# Patient Record
Sex: Female | Born: 1950 | Race: White | Hispanic: No | Marital: Married | Smoking: Never smoker
Health system: Southern US, Community
[De-identification: ages and names within clinical notes are randomized; demographics above are authoritative.]

## PROBLEM LIST (undated history)

## (undated) DIAGNOSIS — K219 Gastro-esophageal reflux disease without esophagitis: Secondary | ICD-10-CM

## (undated) DIAGNOSIS — I10 Essential (primary) hypertension: Secondary | ICD-10-CM

## (undated) DIAGNOSIS — C801 Malignant (primary) neoplasm, unspecified: Secondary | ICD-10-CM

## (undated) DIAGNOSIS — E782 Mixed hyperlipidemia: Secondary | ICD-10-CM

## (undated) DIAGNOSIS — E039 Hypothyroidism, unspecified: Secondary | ICD-10-CM

## (undated) DIAGNOSIS — I4891 Unspecified atrial fibrillation: Secondary | ICD-10-CM

## (undated) DIAGNOSIS — C4490 Unspecified malignant neoplasm of skin, unspecified: Secondary | ICD-10-CM

## (undated) HISTORY — PX: SKIN CANCER DESTRUCTION: SHX778

## (undated) HISTORY — DX: Gastro-esophageal reflux disease without esophagitis: K21.9

## (undated) HISTORY — DX: Hypothyroidism, unspecified: E03.9

## (undated) HISTORY — DX: Unspecified atrial fibrillation: I48.91

## (undated) HISTORY — PX: ABDOMINAL HYSTERECTOMY: SHX81

## (undated) HISTORY — PX: FACIAL RECONSTRUCTION SURGERY: SHX631

## (undated) HISTORY — DX: Unspecified malignant neoplasm of skin, unspecified: C44.90

## (undated) HISTORY — DX: Mixed hyperlipidemia: E78.2

## (undated) HISTORY — PX: OTHER SURGICAL HISTORY: SHX169

## (undated) HISTORY — DX: Essential (primary) hypertension: I10

## (undated) HISTORY — PX: SKIN CANCER EXCISION: SHX779

---

## 2004-10-12 ENCOUNTER — Ambulatory Visit: Payer: Self-pay | Admitting: *Deleted

## 2004-10-16 ENCOUNTER — Ambulatory Visit (HOSPITAL_COMMUNITY): Admission: RE | Admit: 2004-10-16 | Discharge: 2004-10-16 | Payer: Self-pay | Admitting: *Deleted

## 2004-10-16 ENCOUNTER — Ambulatory Visit: Payer: Self-pay | Admitting: Cardiology

## 2004-10-23 ENCOUNTER — Ambulatory Visit: Payer: Self-pay | Admitting: *Deleted

## 2014-11-24 ENCOUNTER — Emergency Department (HOSPITAL_COMMUNITY): Payer: Medicaid Other

## 2014-11-24 ENCOUNTER — Encounter (HOSPITAL_COMMUNITY): Payer: Self-pay | Admitting: Emergency Medicine

## 2014-11-24 ENCOUNTER — Emergency Department (HOSPITAL_COMMUNITY)
Admission: EM | Admit: 2014-11-24 | Discharge: 2014-11-24 | Disposition: A | Discharge status: Home / Self Care | Payer: Medicaid Other | Attending: Emergency Medicine | Admitting: Emergency Medicine

## 2014-11-24 DIAGNOSIS — W1839XA Other fall on same level, initial encounter: Secondary | ICD-10-CM | POA: Diagnosis not present

## 2014-11-24 DIAGNOSIS — Z880 Allergy status to penicillin: Secondary | ICD-10-CM | POA: Diagnosis not present

## 2014-11-24 DIAGNOSIS — Z91040 Latex allergy status: Secondary | ICD-10-CM | POA: Insufficient documentation

## 2014-11-24 DIAGNOSIS — S42252A Displaced fracture of greater tuberosity of left humerus, initial encounter for closed fracture: Secondary | ICD-10-CM | POA: Insufficient documentation

## 2014-11-24 DIAGNOSIS — S4992XA Unspecified injury of left shoulder and upper arm, initial encounter: Secondary | ICD-10-CM | POA: Diagnosis present

## 2014-11-24 DIAGNOSIS — Y998 Other external cause status: Secondary | ICD-10-CM | POA: Insufficient documentation

## 2014-11-24 DIAGNOSIS — Z8679 Personal history of other diseases of the circulatory system: Secondary | ICD-10-CM | POA: Insufficient documentation

## 2014-11-24 DIAGNOSIS — Z8589 Personal history of malignant neoplasm of other organs and systems: Secondary | ICD-10-CM | POA: Insufficient documentation

## 2014-11-24 DIAGNOSIS — Y9289 Other specified places as the place of occurrence of the external cause: Secondary | ICD-10-CM | POA: Diagnosis not present

## 2014-11-24 DIAGNOSIS — Y9389 Activity, other specified: Secondary | ICD-10-CM | POA: Diagnosis not present

## 2014-11-24 DIAGNOSIS — S42102A Fracture of unspecified part of scapula, left shoulder, initial encounter for closed fracture: Secondary | ICD-10-CM | POA: Diagnosis present

## 2014-11-24 DIAGNOSIS — Z87891 Personal history of nicotine dependence: Secondary | ICD-10-CM | POA: Insufficient documentation

## 2014-11-24 DIAGNOSIS — S42302A Unspecified fracture of shaft of humerus, left arm, initial encounter for closed fracture: Secondary | ICD-10-CM

## 2014-11-24 HISTORY — DX: Malignant (primary) neoplasm, unspecified: C80.1

## 2014-11-24 HISTORY — DX: Unspecified atrial fibrillation: I48.91

## 2014-11-24 MED ORDER — HYDROMORPHONE HCL 1 MG/ML IJ SOLN
1.0000 mg | Freq: Once | INTRAMUSCULAR | Status: AC
  Administered 2014-11-24: 1 mg via INTRAMUSCULAR
  Filled 2014-11-24: qty 1

## 2014-11-24 MED ORDER — OXYCODONE-ACETAMINOPHEN 5-325 MG PO TABS: 1.0000 | ORAL_TABLET | ORAL | Status: DC | PRN

## 2014-11-24 MED ORDER — OXYCODONE-ACETAMINOPHEN 5-325 MG PO TABS
2.0000 | ORAL_TABLET | Freq: Once | ORAL | Status: AC
  Administered 2014-11-24: 2 via ORAL
  Filled 2014-11-24: qty 2

## 2014-11-24 MED ORDER — SODIUM CHLORIDE 0.9 % IV BOLUS (SEPSIS): 500.0000 mL | Freq: Once | INTRAVENOUS | Status: DC

## 2014-11-24 NOTE — ED Notes (Signed)
PT c/o falling upon her left shoulder approx 2 hrs ago. PT c/o left shoulder pain and difficulty with ROM.

## 2014-11-24 NOTE — Discharge Instructions (Signed)
Sling, pain medicine, follow-up with orthopedic doctor. Call tomorrow for appointment next week. Phone number given. You have broken your humerus bone

## 2014-11-24 NOTE — ED Provider Notes (Signed)
CSN: [id number]     Arrival date & time 11/24/14  1614 History  This chart was scribed for Nat Christen, MD by Delphia Grates, ED Scribe. This patient was seen in room APA03/APA03 and the patient's care was started at 4:57 PM.    Chief Complaint  Patient presents with  . Shoulder Pain    The history is provided by the patient. No language interpreter was used.     HPI Comments: Tracy Rivera is a 64 y.o. female who presents to the Emergency Department complaining of a fall that occurred approximately 2 hours ago. Patient states her knee gave out causing her to fall. She reports landing on her left side and subsequently injuring her left shoulder. She notes constant, severe pain to the shoulder that radiates down to the left upper arm. There is associated decreased ROM of the LUE. She denies LOC or any other injuries.   Past Medical History  Diagnosis Date  . Cancer   . A-fib    Past Surgical History  Procedure Laterality Date  . Skin cancer destruction    . Left arm nerve    . Facial reconstruction surgery     No family history on file. History  Substance Use Topics  . Smoking status: Former Research scientist (life sciences)  . Smokeless tobacco: Not on file  . Alcohol Use: No   OB History    Gravida Para Term Preterm AB TAB SAB Ectopic Multiple Living            2     Review of Systems  A complete 10 system review of systems was obtained and all systems are negative except as noted in the HPI and PMH.    Allergies  Propoxyphene; Celecoxib; Erythromycin; Lanolin; Latex; Naproxen; Other; Oxytetracycline; Penicillins; Prednisone; Tape; and Codeine  Home Medications   Prior to Admission medications   Medication Sig Start Date End Date Taking? Authorizing Provider  oxyCODONE-acetaminophen (PERCOCET) 5-325 MG per tablet Take 1-2 tablets by mouth every 4 (four) hours as needed. 11/24/14   Nat Christen, MD   Triage Vitals: BP 187/72 mmHg  Pulse 83  Temp(Src) 98 F (36.7 C) (Oral)  Resp 20  Ht  4\' 11"  (1.499 m)  Wt 176 lb (79.833 kg)  BMI 35.53 kg/m2  SpO2 98%  Physical Exam  Constitutional: She is oriented to person, place, and time. She appears well-developed and well-nourished.  HENT:  Head: Normocephalic.  Proximal upper palette and nose were not present.  Eyes: Conjunctivae and EOM are normal. Pupils are equal, round, and reactive to light.  Neck: Normal range of motion. Neck supple.  Cardiovascular: Normal rate and regular rhythm.   Pulmonary/Chest: Effort normal and breath sounds normal.  Abdominal: Soft. Bowel sounds are normal.  Musculoskeletal: Normal range of motion. She exhibits tenderness.  Tenderness to the left humerus.  Neurological: She is alert and oriented to person, place, and time.  Skin: Skin is warm and dry.  Psychiatric: She has a normal mood and affect. Her behavior is normal.  Nursing note and vitals reviewed.   ED Course  Procedures (including critical care time)  DIAGNOSTIC STUDIES: Oxygen Saturation is 98% on room air, normal by my interpretation.    COORDINATION OF CARE: At 1700 Discussed treatment plan with patient which includes imaging. Patient agrees.   At [place] Discussed with patient the imaging results that revealed positive findings for fracture of the left scapula and humerus. Informed patient that this type of fracture will heal on its own  and does not require surgical repair. Patient informed me that she had surgery on the left scapula area and states that she is not in any pain in that area at this time.  Will order sling and prescribe pain medication. Patient agrees.   Labs Review Labs Reviewed - No data to display  Imaging Review  Dg Shoulder Left  11/24/2014   CLINICAL DATA:  Left shoulder pain secondary to a fall.  EXAM: LEFT SHOULDER - 2+ VIEW  COMPARISON:  CT scan of the neck dated 11/29/2010  FINDINGS: There is an impacted fracture of the left humeral neck as well as a fracture through the greater tuberosity of the  proximal humerus.  There also appears to be a comminuted angulated fracture of the scapula.  IMPRESSION: 1. Comminuted fracture of the scapula. 2. Impacted comminuted fracture of the proximal left humerus.   Electronically Signed   By: Lorriane Shire M.D.   On: 11/24/2014 17:55   Dg Humerus Left  11/24/2014   CLINICAL DATA:  LEFT shoulder and elbow pain, slipped and fell landing on LEFT shoulder  EXAM: LEFT HUMERUS - 2+ VIEW  COMPARISON:  11/24/2014 LEFT shoulder radiographs  FINDINGS: Osseous demineralization.  AC joint alignment normal.  Minimally displaced fracture surgical neck LEFT humerus.  No acute fracture, dislocation or bone destruction.  Surgical clips LEFT axilla.  IMPRESSION: Osseous mineralization with mildly displaced fracture at surgical neck LEFT humerus.   Electronically Signed   By: Lavonia Dana M.D.   On: 11/24/2014 17:51      EKG Interpretation None      MDM   Final diagnoses:  Humerus fracture, left, closed, initial encounter   Further history obtained from patient and her husband. She has had a previous bone graft taken from the left scapula. She is nontender over the scapula. Abnormal x-ray reading pertains to previous scapular surgery. Sling, pain medicine, referral to orthopedics for left humeral fracture    I personally performed the services described in this documentation, which was scribed in my presence. The recorded information has been reviewed and is accurate.   Nat Christen, MD 11/24/14 [phone]

## 2014-11-24 NOTE — ED Notes (Signed)
Pt back from xray. Pt stated she needed to go to restroom prior to being medicated. Husband wished to take her himself by wheelchair. NAD at this time. Pt to be medicated after using restroom.

## 2016-02-26 DIAGNOSIS — R928 Other abnormal and inconclusive findings on diagnostic imaging of breast: Secondary | ICD-10-CM | POA: Diagnosis not present

## 2016-02-26 DIAGNOSIS — J209 Acute bronchitis, unspecified: Secondary | ICD-10-CM | POA: Diagnosis not present

## 2016-02-26 DIAGNOSIS — J019 Acute sinusitis, unspecified: Secondary | ICD-10-CM | POA: Diagnosis not present

## 2016-04-03 DIAGNOSIS — M25562 Pain in left knee: Secondary | ICD-10-CM | POA: Diagnosis not present

## 2016-04-03 DIAGNOSIS — M170 Bilateral primary osteoarthritis of knee: Secondary | ICD-10-CM | POA: Diagnosis not present

## 2016-04-03 DIAGNOSIS — M25561 Pain in right knee: Secondary | ICD-10-CM | POA: Diagnosis not present

## 2016-04-26 DIAGNOSIS — E782 Mixed hyperlipidemia: Secondary | ICD-10-CM | POA: Diagnosis not present

## 2016-04-26 DIAGNOSIS — R946 Abnormal results of thyroid function studies: Secondary | ICD-10-CM | POA: Diagnosis not present

## 2016-04-26 DIAGNOSIS — I10 Essential (primary) hypertension: Secondary | ICD-10-CM | POA: Diagnosis not present

## 2016-04-30 DIAGNOSIS — E782 Mixed hyperlipidemia: Secondary | ICD-10-CM | POA: Diagnosis not present

## 2016-04-30 DIAGNOSIS — R946 Abnormal results of thyroid function studies: Secondary | ICD-10-CM | POA: Diagnosis not present

## 2016-04-30 DIAGNOSIS — Z85828 Personal history of other malignant neoplasm of skin: Secondary | ICD-10-CM | POA: Diagnosis not present

## 2016-04-30 DIAGNOSIS — I10 Essential (primary) hypertension: Secondary | ICD-10-CM | POA: Diagnosis not present

## 2016-04-30 DIAGNOSIS — J019 Acute sinusitis, unspecified: Secondary | ICD-10-CM | POA: Diagnosis not present

## 2016-04-30 DIAGNOSIS — I482 Chronic atrial fibrillation: Secondary | ICD-10-CM | POA: Diagnosis not present

## 2016-07-07 IMAGING — DX DG SHOULDER 2+V*L*
2 series · 2 of 2 positions shown · non-contrast
Comparison: CT scan of the neck dated 11/29/2010

CLINICAL DATA: Left shoulder pain secondary to a fall.

EXAM:
LEFT SHOULDER - 2+ VIEW

[shoulder grashey]
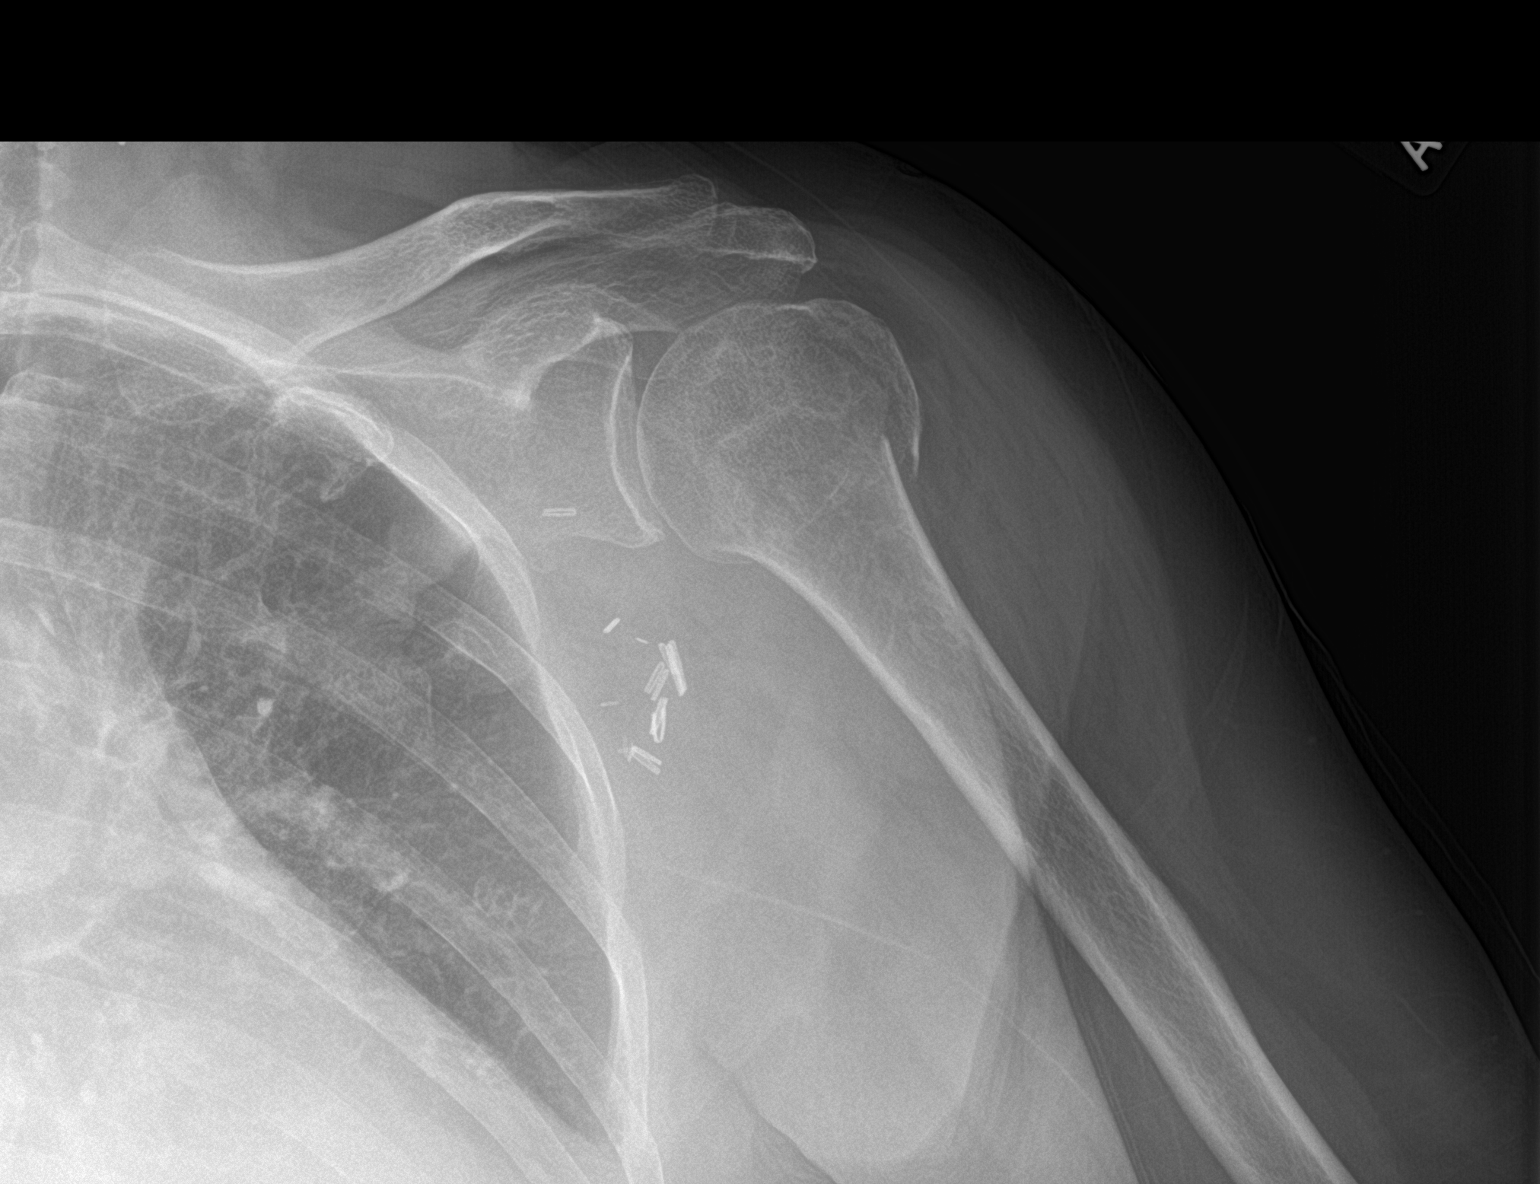

[shoulder y view]
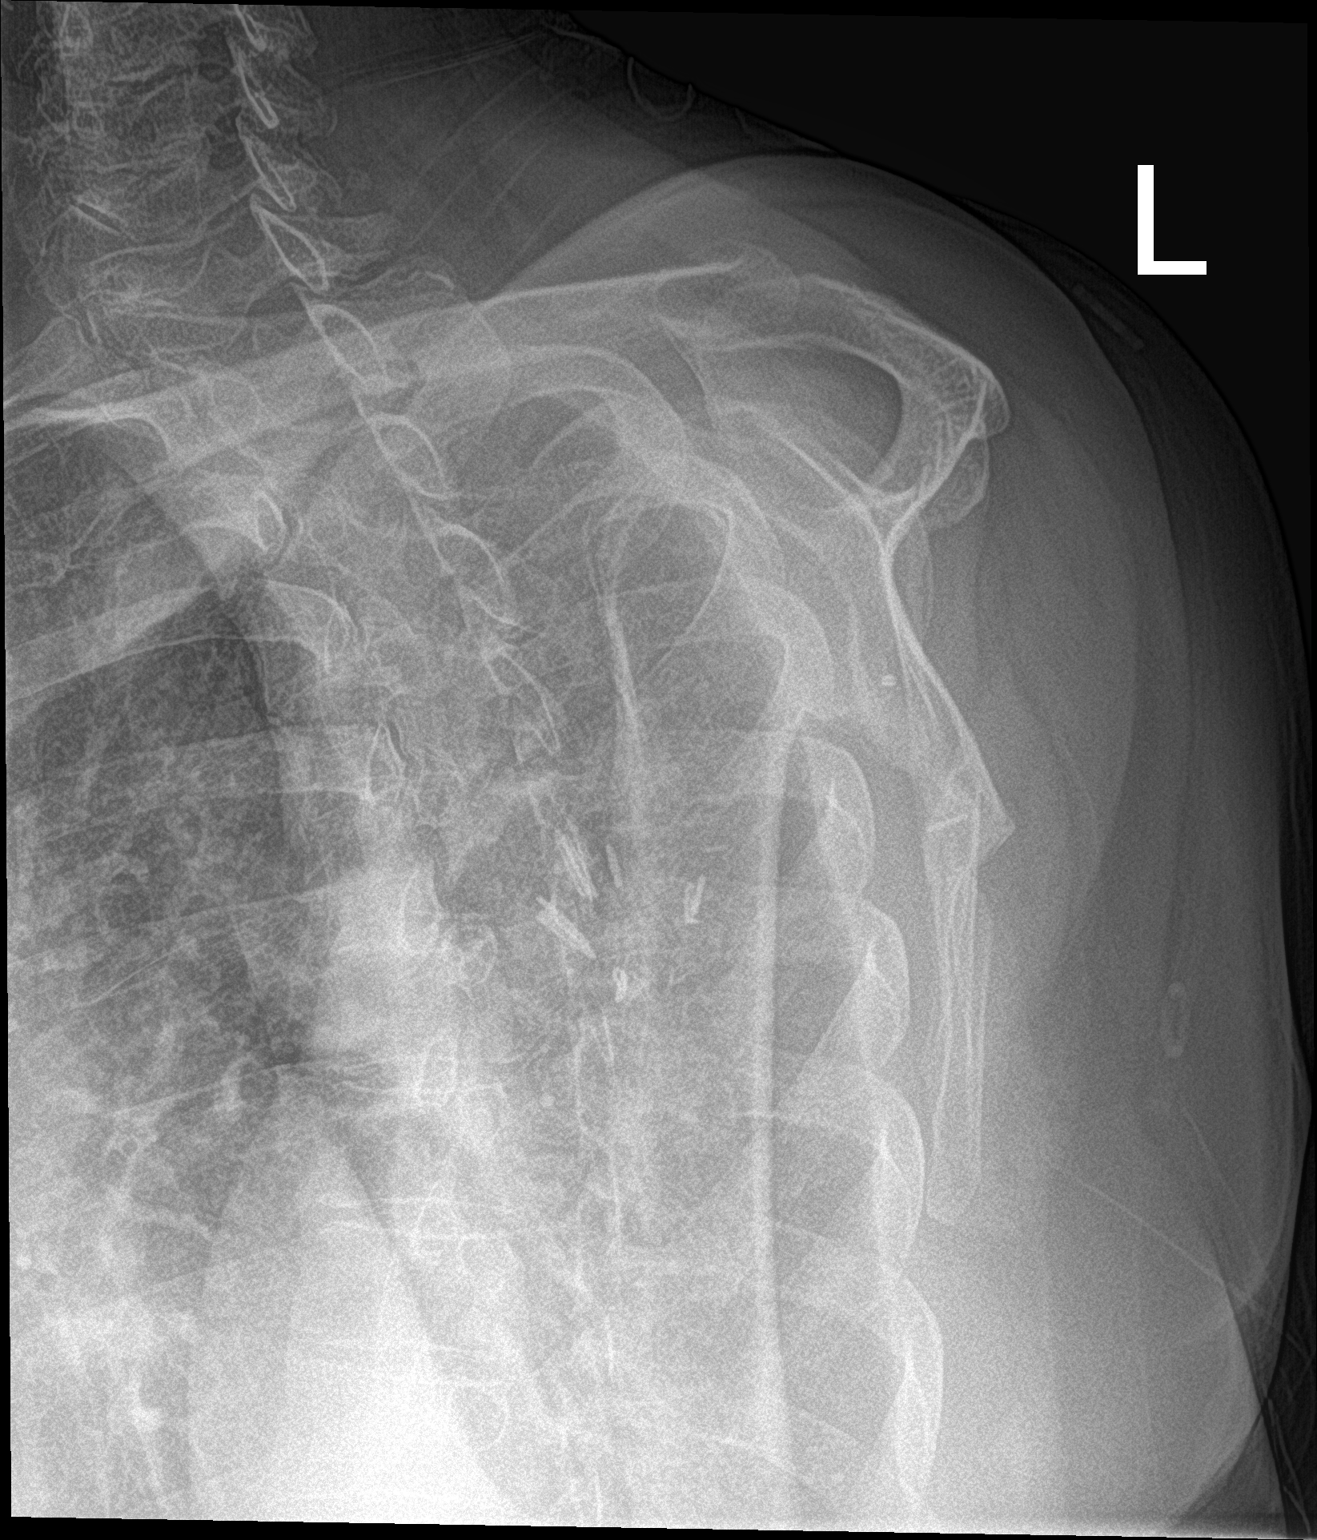

[2 of 2 positions shown; findings below may reference images not displayed]

FINDINGS: There is an impacted fracture of the left humeral neck as well as a
fracture through the greater tuberosity of the proximal humerus.

There also appears to be a comminuted angulated fracture of the
scapula.
IMPRESSION: 1. Comminuted fracture of the scapula.
2. Impacted comminuted fracture of the proximal left humerus.

## 2016-07-07 IMAGING — DX DG HUMERUS 2V *L*
3 series · 3 of 3 positions shown · non-contrast
Comparison: 11/24/2014 LEFT shoulder radiographs

CLINICAL DATA: LEFT shoulder and elbow pain, slipped and fell
landing on LEFT shoulder

EXAM:
LEFT HUMERUS - 2+ VIEW

[humerus ap]
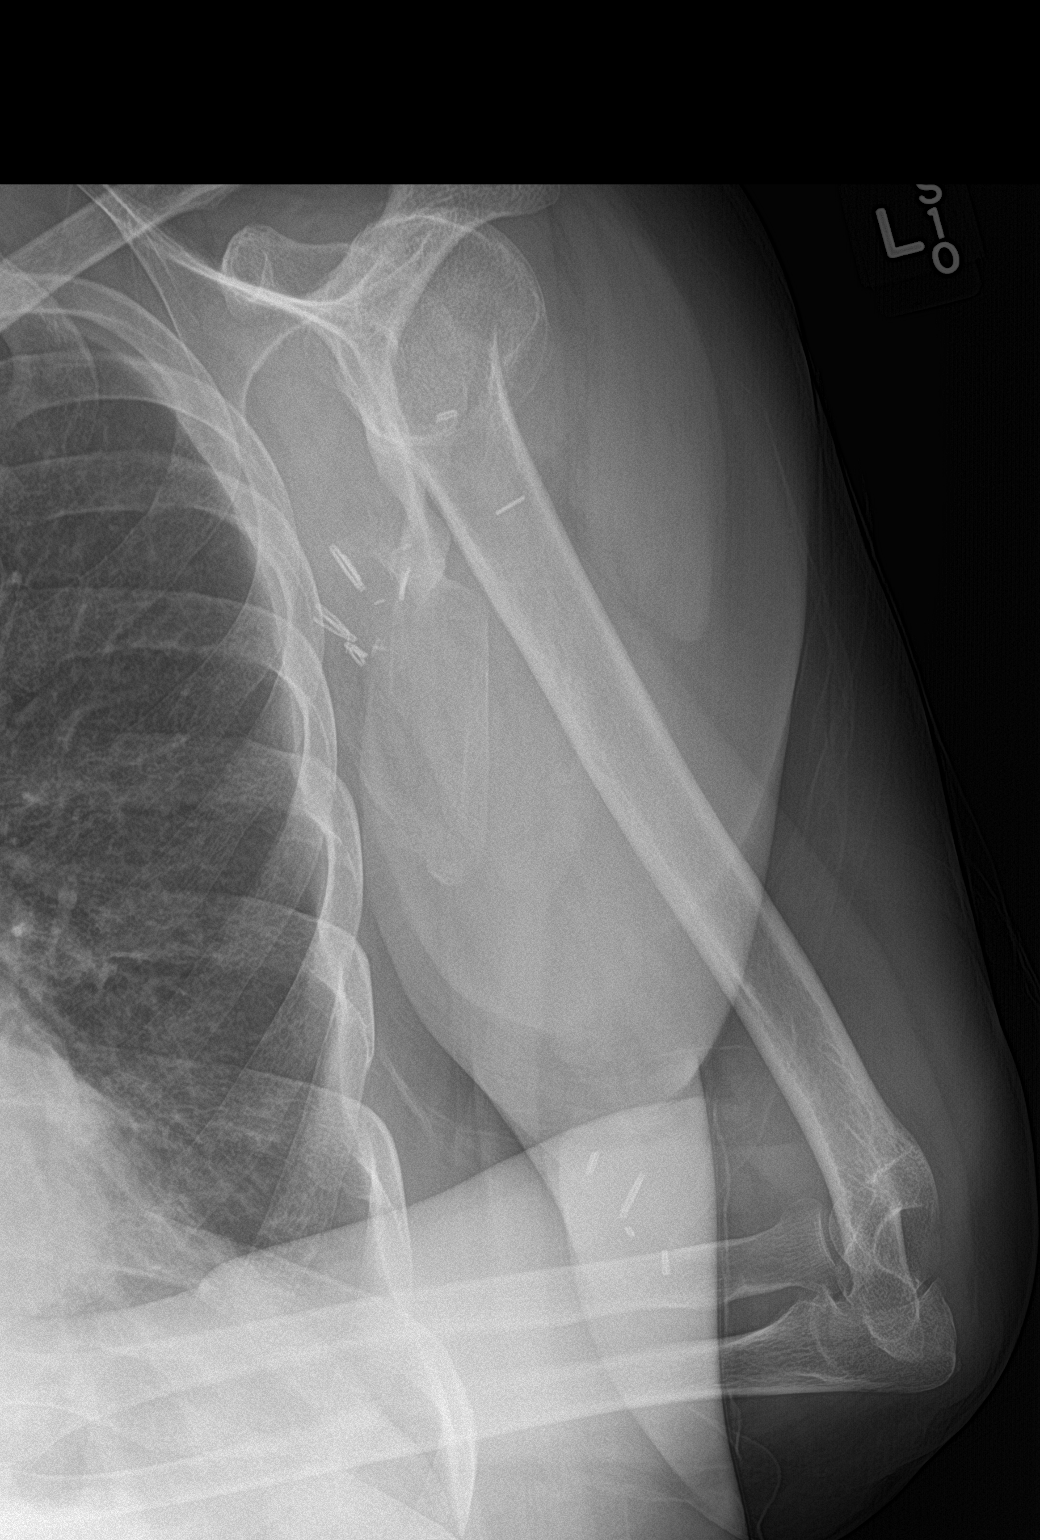

[humerus lat (1 of 2)]
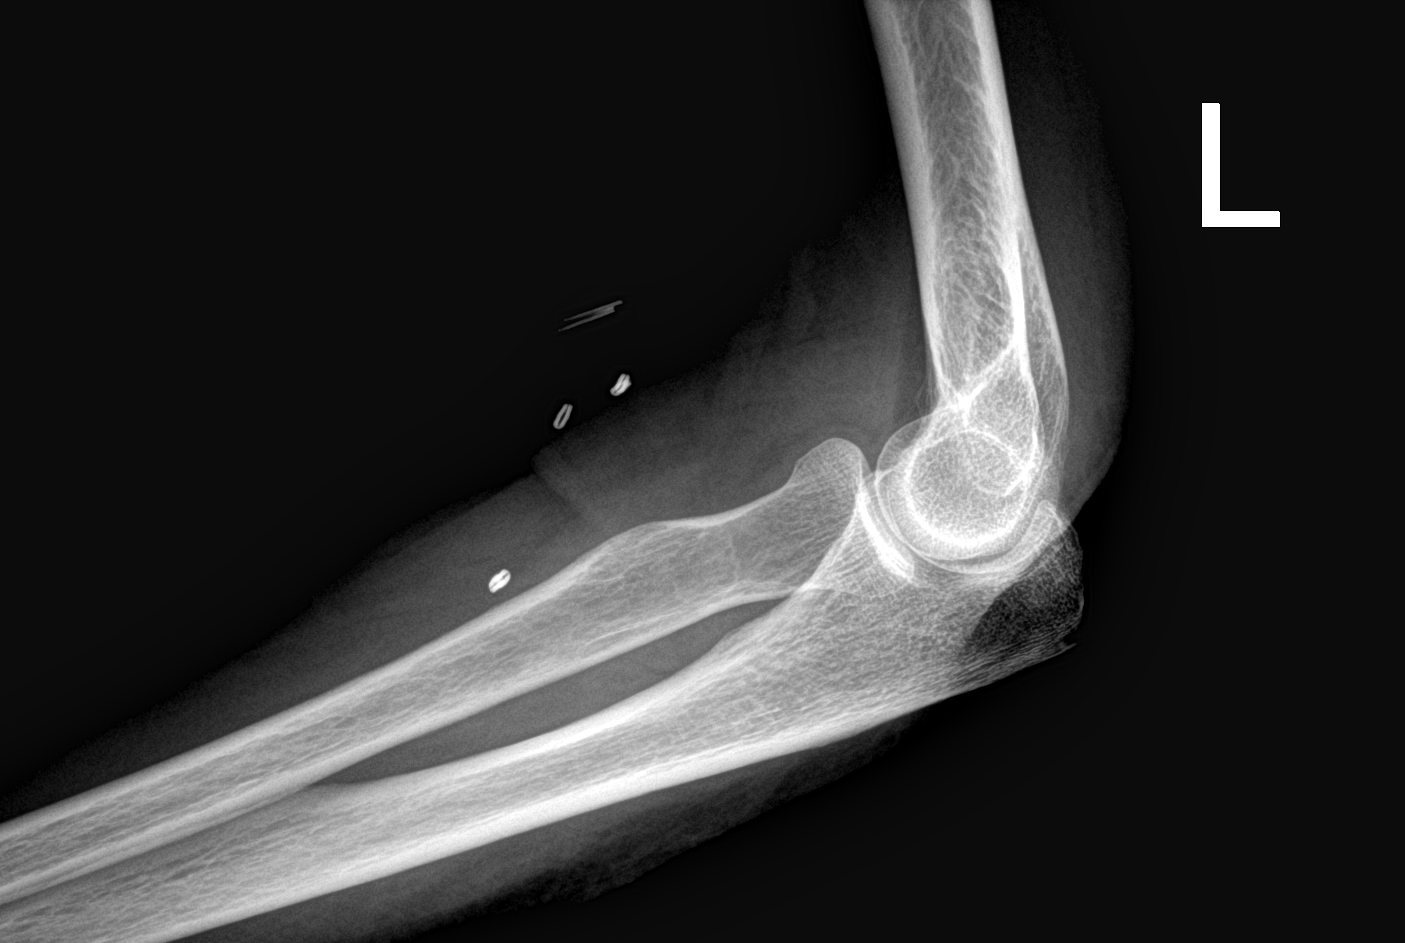

[humerus lat (2 of 2)]
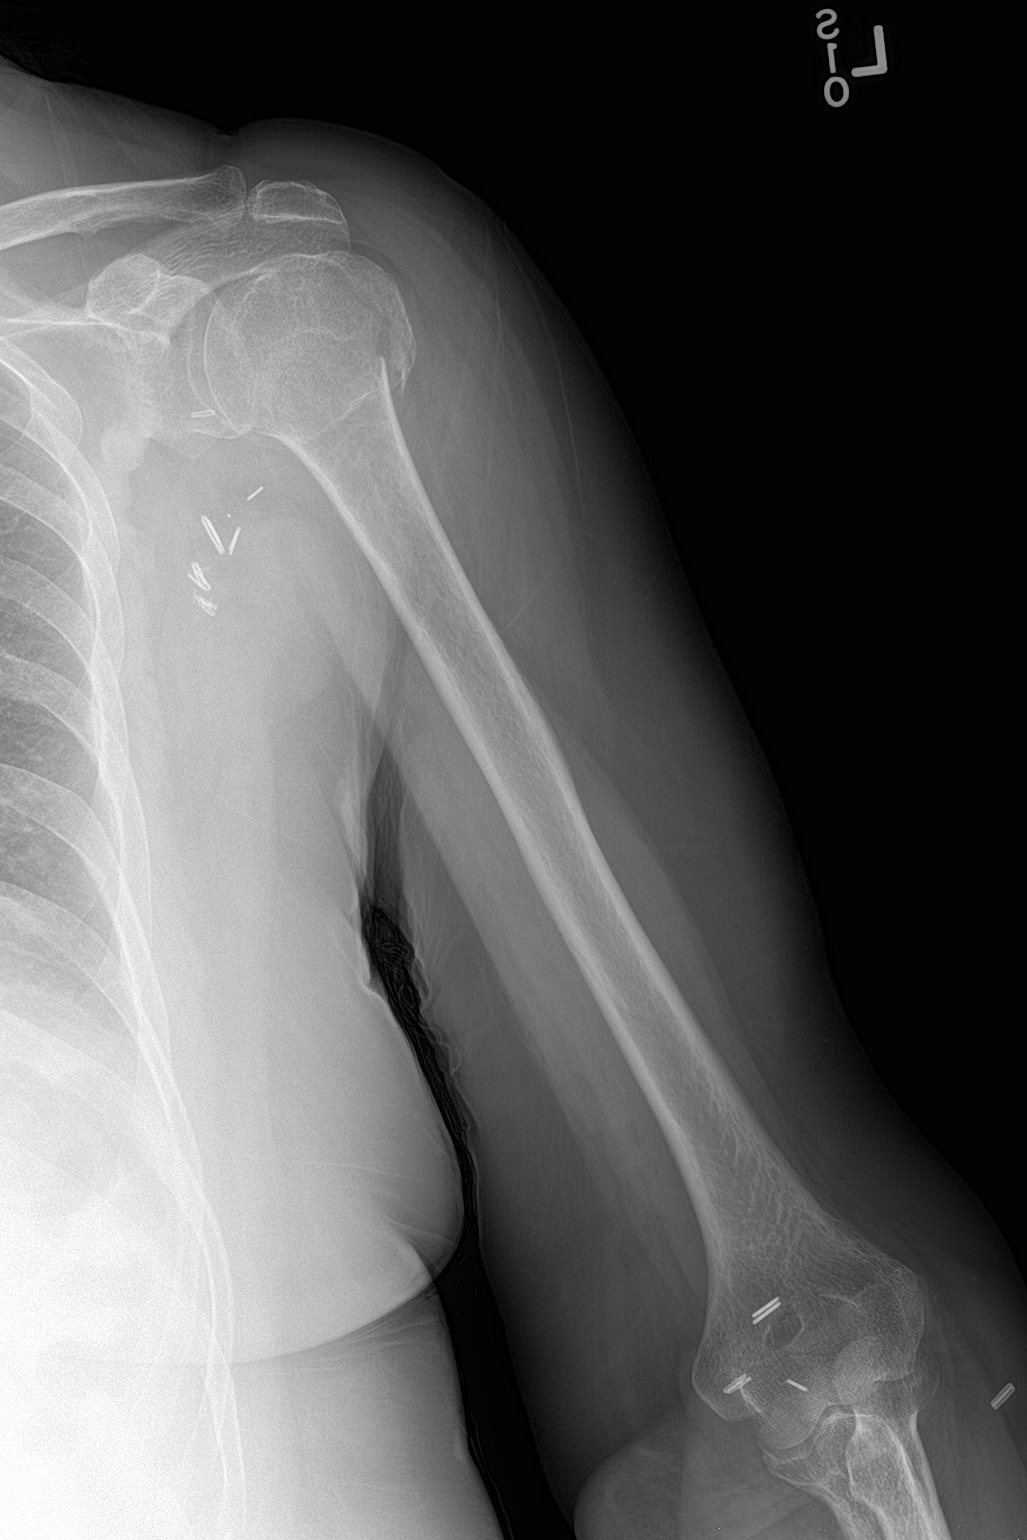

[3 of 3 positions shown; findings below may reference images not displayed]

FINDINGS: Osseous demineralization.

AC joint alignment normal.

Minimally displaced fracture surgical neck LEFT humerus.

No acute fracture, dislocation or bone destruction.

Surgical clips LEFT axilla.
IMPRESSION: Osseous mineralization with mildly displaced fracture at surgical
neck LEFT humerus.

## 2016-07-30 DIAGNOSIS — Z23 Encounter for immunization: Secondary | ICD-10-CM | POA: Diagnosis not present

## 2016-08-05 DIAGNOSIS — I10 Essential (primary) hypertension: Secondary | ICD-10-CM | POA: Diagnosis not present

## 2016-08-05 DIAGNOSIS — M950 Acquired deformity of nose: Secondary | ICD-10-CM | POA: Diagnosis not present

## 2016-08-05 DIAGNOSIS — Z923 Personal history of irradiation: Secondary | ICD-10-CM | POA: Diagnosis not present

## 2016-08-05 DIAGNOSIS — Z79899 Other long term (current) drug therapy: Secondary | ICD-10-CM | POA: Diagnosis not present

## 2016-08-05 DIAGNOSIS — Z880 Allergy status to penicillin: Secondary | ICD-10-CM | POA: Diagnosis not present

## 2016-08-05 DIAGNOSIS — Z87891 Personal history of nicotine dependence: Secondary | ICD-10-CM | POA: Diagnosis not present

## 2016-08-05 DIAGNOSIS — Z9089 Acquired absence of other organs: Secondary | ICD-10-CM | POA: Diagnosis not present

## 2016-08-05 DIAGNOSIS — Z886 Allergy status to analgesic agent status: Secondary | ICD-10-CM | POA: Diagnosis not present

## 2016-08-05 DIAGNOSIS — Z888 Allergy status to other drugs, medicaments and biological substances status: Secondary | ICD-10-CM | POA: Diagnosis not present

## 2016-08-05 DIAGNOSIS — Z09 Encounter for follow-up examination after completed treatment for conditions other than malignant neoplasm: Secondary | ICD-10-CM | POA: Diagnosis not present

## 2016-08-05 DIAGNOSIS — I4891 Unspecified atrial fibrillation: Secondary | ICD-10-CM | POA: Diagnosis not present

## 2016-08-05 DIAGNOSIS — Z881 Allergy status to other antibiotic agents status: Secondary | ICD-10-CM | POA: Diagnosis not present

## 2016-08-05 DIAGNOSIS — Z885 Allergy status to narcotic agent status: Secondary | ICD-10-CM | POA: Diagnosis not present

## 2016-08-28 DIAGNOSIS — R946 Abnormal results of thyroid function studies: Secondary | ICD-10-CM | POA: Diagnosis not present

## 2016-08-28 DIAGNOSIS — I10 Essential (primary) hypertension: Secondary | ICD-10-CM | POA: Diagnosis not present

## 2016-08-28 DIAGNOSIS — E782 Mixed hyperlipidemia: Secondary | ICD-10-CM | POA: Diagnosis not present

## 2016-08-30 DIAGNOSIS — E782 Mixed hyperlipidemia: Secondary | ICD-10-CM | POA: Diagnosis not present

## 2016-08-30 DIAGNOSIS — Z85828 Personal history of other malignant neoplasm of skin: Secondary | ICD-10-CM | POA: Diagnosis not present

## 2016-08-30 DIAGNOSIS — I482 Chronic atrial fibrillation: Secondary | ICD-10-CM | POA: Diagnosis not present

## 2016-08-30 DIAGNOSIS — E039 Hypothyroidism, unspecified: Secondary | ICD-10-CM | POA: Diagnosis not present

## 2016-08-30 DIAGNOSIS — I10 Essential (primary) hypertension: Secondary | ICD-10-CM | POA: Diagnosis not present

## 2016-09-11 DIAGNOSIS — M25562 Pain in left knee: Secondary | ICD-10-CM | POA: Diagnosis not present

## 2016-09-11 DIAGNOSIS — Z4789 Encounter for other orthopedic aftercare: Secondary | ICD-10-CM | POA: Diagnosis not present

## 2016-09-11 DIAGNOSIS — M25561 Pain in right knee: Secondary | ICD-10-CM | POA: Diagnosis not present

## 2016-09-11 DIAGNOSIS — M170 Bilateral primary osteoarthritis of knee: Secondary | ICD-10-CM | POA: Diagnosis not present

## 2016-09-27 DIAGNOSIS — M170 Bilateral primary osteoarthritis of knee: Secondary | ICD-10-CM | POA: Diagnosis not present

## 2016-09-27 DIAGNOSIS — M25362 Other instability, left knee: Secondary | ICD-10-CM | POA: Diagnosis not present

## 2016-09-27 DIAGNOSIS — M25361 Other instability, right knee: Secondary | ICD-10-CM | POA: Diagnosis not present

## 2016-10-10 DIAGNOSIS — E039 Hypothyroidism, unspecified: Secondary | ICD-10-CM | POA: Diagnosis not present

## 2016-11-04 DIAGNOSIS — R590 Localized enlarged lymph nodes: Secondary | ICD-10-CM | POA: Diagnosis not present

## 2016-11-04 DIAGNOSIS — R599 Enlarged lymph nodes, unspecified: Secondary | ICD-10-CM | POA: Diagnosis not present

## 2016-11-04 DIAGNOSIS — Z885 Allergy status to narcotic agent status: Secondary | ICD-10-CM | POA: Diagnosis not present

## 2016-11-04 DIAGNOSIS — I4891 Unspecified atrial fibrillation: Secondary | ICD-10-CM | POA: Diagnosis not present

## 2016-11-04 DIAGNOSIS — Z886 Allergy status to analgesic agent status: Secondary | ICD-10-CM | POA: Diagnosis not present

## 2016-11-04 DIAGNOSIS — Z881 Allergy status to other antibiotic agents status: Secondary | ICD-10-CM | POA: Diagnosis not present

## 2016-11-04 DIAGNOSIS — Z7901 Long term (current) use of anticoagulants: Secondary | ICD-10-CM | POA: Diagnosis not present

## 2016-11-04 DIAGNOSIS — Z888 Allergy status to other drugs, medicaments and biological substances status: Secondary | ICD-10-CM | POA: Diagnosis not present

## 2016-11-04 DIAGNOSIS — M950 Acquired deformity of nose: Secondary | ICD-10-CM | POA: Diagnosis not present

## 2016-11-04 DIAGNOSIS — C300 Malignant neoplasm of nasal cavity: Secondary | ICD-10-CM | POA: Diagnosis not present

## 2016-11-04 DIAGNOSIS — Z87891 Personal history of nicotine dependence: Secondary | ICD-10-CM | POA: Diagnosis not present

## 2016-11-04 DIAGNOSIS — Z7982 Long term (current) use of aspirin: Secondary | ICD-10-CM | POA: Diagnosis not present

## 2016-11-04 DIAGNOSIS — Z880 Allergy status to penicillin: Secondary | ICD-10-CM | POA: Diagnosis not present

## 2016-11-04 DIAGNOSIS — Z9889 Other specified postprocedural states: Secondary | ICD-10-CM | POA: Diagnosis not present

## 2016-11-04 DIAGNOSIS — I10 Essential (primary) hypertension: Secondary | ICD-10-CM | POA: Diagnosis not present

## 2016-11-04 DIAGNOSIS — Z85828 Personal history of other malignant neoplasm of skin: Secondary | ICD-10-CM | POA: Diagnosis not present

## 2016-11-04 DIAGNOSIS — Z79899 Other long term (current) drug therapy: Secondary | ICD-10-CM | POA: Diagnosis not present

## 2016-11-15 DIAGNOSIS — C300 Malignant neoplasm of nasal cavity: Secondary | ICD-10-CM | POA: Diagnosis not present

## 2016-11-15 DIAGNOSIS — M950 Acquired deformity of nose: Secondary | ICD-10-CM | POA: Diagnosis not present

## 2016-11-15 DIAGNOSIS — R590 Localized enlarged lymph nodes: Secondary | ICD-10-CM | POA: Diagnosis not present

## 2016-12-11 DIAGNOSIS — C300 Malignant neoplasm of nasal cavity: Secondary | ICD-10-CM | POA: Diagnosis not present

## 2016-12-16 DIAGNOSIS — C300 Malignant neoplasm of nasal cavity: Secondary | ICD-10-CM | POA: Diagnosis not present

## 2016-12-16 DIAGNOSIS — Z87891 Personal history of nicotine dependence: Secondary | ICD-10-CM | POA: Diagnosis not present

## 2016-12-23 DIAGNOSIS — I482 Chronic atrial fibrillation: Secondary | ICD-10-CM | POA: Diagnosis not present

## 2016-12-23 DIAGNOSIS — E782 Mixed hyperlipidemia: Secondary | ICD-10-CM | POA: Diagnosis not present

## 2016-12-23 DIAGNOSIS — E039 Hypothyroidism, unspecified: Secondary | ICD-10-CM | POA: Diagnosis not present

## 2016-12-23 DIAGNOSIS — I10 Essential (primary) hypertension: Secondary | ICD-10-CM | POA: Diagnosis not present

## 2016-12-25 DIAGNOSIS — I10 Essential (primary) hypertension: Secondary | ICD-10-CM | POA: Diagnosis not present

## 2016-12-25 DIAGNOSIS — I482 Chronic atrial fibrillation: Secondary | ICD-10-CM | POA: Diagnosis not present

## 2016-12-25 DIAGNOSIS — R946 Abnormal results of thyroid function studies: Secondary | ICD-10-CM | POA: Diagnosis not present

## 2016-12-25 DIAGNOSIS — J019 Acute sinusitis, unspecified: Secondary | ICD-10-CM | POA: Diagnosis not present

## 2016-12-25 DIAGNOSIS — Z85828 Personal history of other malignant neoplasm of skin: Secondary | ICD-10-CM | POA: Diagnosis not present

## 2016-12-25 DIAGNOSIS — E782 Mixed hyperlipidemia: Secondary | ICD-10-CM | POA: Diagnosis not present

## 2016-12-25 DIAGNOSIS — Z23 Encounter for immunization: Secondary | ICD-10-CM | POA: Diagnosis not present

## 2016-12-26 DIAGNOSIS — C300 Malignant neoplasm of nasal cavity: Secondary | ICD-10-CM | POA: Diagnosis not present

## 2016-12-26 DIAGNOSIS — H269 Unspecified cataract: Secondary | ICD-10-CM | POA: Diagnosis not present

## 2016-12-26 DIAGNOSIS — S0502XA Injury of conjunctiva and corneal abrasion without foreign body, left eye, initial encounter: Secondary | ICD-10-CM | POA: Diagnosis not present

## 2016-12-26 DIAGNOSIS — I481 Persistent atrial fibrillation: Secondary | ICD-10-CM | POA: Diagnosis not present

## 2016-12-26 DIAGNOSIS — C44311 Basal cell carcinoma of skin of nose: Secondary | ICD-10-CM | POA: Diagnosis not present

## 2016-12-26 DIAGNOSIS — M7989 Other specified soft tissue disorders: Secondary | ICD-10-CM | POA: Diagnosis not present

## 2017-01-02 DIAGNOSIS — H269 Unspecified cataract: Secondary | ICD-10-CM | POA: Diagnosis not present

## 2017-01-02 DIAGNOSIS — C7989 Secondary malignant neoplasm of other specified sites: Secondary | ICD-10-CM | POA: Diagnosis not present

## 2017-01-02 DIAGNOSIS — J349 Unspecified disorder of nose and nasal sinuses: Secondary | ICD-10-CM | POA: Diagnosis not present

## 2017-01-02 DIAGNOSIS — S0502XA Injury of conjunctiva and corneal abrasion without foreign body, left eye, initial encounter: Secondary | ICD-10-CM | POA: Diagnosis not present

## 2017-01-02 DIAGNOSIS — M899 Disorder of bone, unspecified: Secondary | ICD-10-CM | POA: Diagnosis not present

## 2017-01-02 DIAGNOSIS — C4481 Basal cell carcinoma of overlapping sites of skin: Secondary | ICD-10-CM | POA: Diagnosis not present

## 2017-01-02 DIAGNOSIS — C44311 Basal cell carcinoma of skin of nose: Secondary | ICD-10-CM | POA: Diagnosis not present

## 2017-01-03 DIAGNOSIS — S0502XA Injury of conjunctiva and corneal abrasion without foreign body, left eye, initial encounter: Secondary | ICD-10-CM | POA: Diagnosis not present

## 2017-01-03 DIAGNOSIS — H269 Unspecified cataract: Secondary | ICD-10-CM | POA: Diagnosis not present

## 2017-01-03 DIAGNOSIS — C44311 Basal cell carcinoma of skin of nose: Secondary | ICD-10-CM | POA: Diagnosis not present

## 2017-01-13 DIAGNOSIS — Z91040 Latex allergy status: Secondary | ICD-10-CM | POA: Diagnosis not present

## 2017-01-13 DIAGNOSIS — I4891 Unspecified atrial fibrillation: Secondary | ICD-10-CM | POA: Diagnosis not present

## 2017-01-13 DIAGNOSIS — Z886 Allergy status to analgesic agent status: Secondary | ICD-10-CM | POA: Diagnosis not present

## 2017-01-13 DIAGNOSIS — Z79899 Other long term (current) drug therapy: Secondary | ICD-10-CM | POA: Diagnosis not present

## 2017-01-13 DIAGNOSIS — Z09 Encounter for follow-up examination after completed treatment for conditions other than malignant neoplasm: Secondary | ICD-10-CM | POA: Diagnosis not present

## 2017-01-13 DIAGNOSIS — Z7982 Long term (current) use of aspirin: Secondary | ICD-10-CM | POA: Diagnosis not present

## 2017-01-13 DIAGNOSIS — Z7901 Long term (current) use of anticoagulants: Secondary | ICD-10-CM | POA: Diagnosis not present

## 2017-01-13 DIAGNOSIS — I10 Essential (primary) hypertension: Secondary | ICD-10-CM | POA: Diagnosis not present

## 2017-01-13 DIAGNOSIS — Z880 Allergy status to penicillin: Secondary | ICD-10-CM | POA: Diagnosis not present

## 2017-01-13 DIAGNOSIS — Z881 Allergy status to other antibiotic agents status: Secondary | ICD-10-CM | POA: Diagnosis not present

## 2017-01-13 DIAGNOSIS — Z885 Allergy status to narcotic agent status: Secondary | ICD-10-CM | POA: Diagnosis not present

## 2017-01-13 DIAGNOSIS — M950 Acquired deformity of nose: Secondary | ICD-10-CM | POA: Diagnosis not present

## 2017-01-13 DIAGNOSIS — Z9089 Acquired absence of other organs: Secondary | ICD-10-CM | POA: Diagnosis not present

## 2017-01-13 DIAGNOSIS — Z87891 Personal history of nicotine dependence: Secondary | ICD-10-CM | POA: Diagnosis not present

## 2017-01-23 DIAGNOSIS — J019 Acute sinusitis, unspecified: Secondary | ICD-10-CM | POA: Diagnosis not present

## 2017-01-23 DIAGNOSIS — R51 Headache: Secondary | ICD-10-CM | POA: Diagnosis not present

## 2017-02-17 DIAGNOSIS — Z483 Aftercare following surgery for neoplasm: Secondary | ICD-10-CM | POA: Diagnosis not present

## 2017-02-17 DIAGNOSIS — D140 Benign neoplasm of middle ear, nasal cavity and accessory sinuses: Secondary | ICD-10-CM | POA: Diagnosis not present

## 2017-02-25 DIAGNOSIS — Z881 Allergy status to other antibiotic agents status: Secondary | ICD-10-CM | POA: Diagnosis not present

## 2017-02-25 DIAGNOSIS — Z7982 Long term (current) use of aspirin: Secondary | ICD-10-CM | POA: Diagnosis not present

## 2017-02-25 DIAGNOSIS — H2513 Age-related nuclear cataract, bilateral: Secondary | ICD-10-CM | POA: Diagnosis not present

## 2017-02-25 DIAGNOSIS — Z880 Allergy status to penicillin: Secondary | ICD-10-CM | POA: Diagnosis not present

## 2017-02-25 DIAGNOSIS — I4891 Unspecified atrial fibrillation: Secondary | ICD-10-CM | POA: Diagnosis not present

## 2017-02-25 DIAGNOSIS — Z91040 Latex allergy status: Secondary | ICD-10-CM | POA: Diagnosis not present

## 2017-02-25 DIAGNOSIS — Z79899 Other long term (current) drug therapy: Secondary | ICD-10-CM | POA: Diagnosis not present

## 2017-02-25 DIAGNOSIS — Z886 Allergy status to analgesic agent status: Secondary | ICD-10-CM | POA: Diagnosis not present

## 2017-02-25 DIAGNOSIS — S0502XA Injury of conjunctiva and corneal abrasion without foreign body, left eye, initial encounter: Secondary | ICD-10-CM | POA: Diagnosis not present

## 2017-02-25 DIAGNOSIS — Z87891 Personal history of nicotine dependence: Secondary | ICD-10-CM | POA: Diagnosis not present

## 2017-02-25 DIAGNOSIS — Z888 Allergy status to other drugs, medicaments and biological substances status: Secondary | ICD-10-CM | POA: Diagnosis not present

## 2017-02-25 DIAGNOSIS — I10 Essential (primary) hypertension: Secondary | ICD-10-CM | POA: Diagnosis not present

## 2017-02-25 DIAGNOSIS — Z7901 Long term (current) use of anticoagulants: Secondary | ICD-10-CM | POA: Diagnosis not present

## 2017-02-25 DIAGNOSIS — H04123 Dry eye syndrome of bilateral lacrimal glands: Secondary | ICD-10-CM | POA: Diagnosis not present

## 2017-02-25 DIAGNOSIS — Z885 Allergy status to narcotic agent status: Secondary | ICD-10-CM | POA: Diagnosis not present

## 2017-03-20 DIAGNOSIS — Z885 Allergy status to narcotic agent status: Secondary | ICD-10-CM | POA: Diagnosis not present

## 2017-03-20 DIAGNOSIS — Z886 Allergy status to analgesic agent status: Secondary | ICD-10-CM | POA: Diagnosis not present

## 2017-03-20 DIAGNOSIS — Z880 Allergy status to penicillin: Secondary | ICD-10-CM | POA: Diagnosis not present

## 2017-03-20 DIAGNOSIS — I10 Essential (primary) hypertension: Secondary | ICD-10-CM | POA: Diagnosis not present

## 2017-03-20 DIAGNOSIS — I481 Persistent atrial fibrillation: Secondary | ICD-10-CM | POA: Diagnosis not present

## 2017-03-20 DIAGNOSIS — C44310 Basal cell carcinoma of skin of unspecified parts of face: Secondary | ICD-10-CM | POA: Diagnosis not present

## 2017-03-20 DIAGNOSIS — Z87891 Personal history of nicotine dependence: Secondary | ICD-10-CM | POA: Diagnosis not present

## 2017-03-20 DIAGNOSIS — Z881 Allergy status to other antibiotic agents status: Secondary | ICD-10-CM | POA: Diagnosis not present

## 2017-03-20 DIAGNOSIS — Z888 Allergy status to other drugs, medicaments and biological substances status: Secondary | ICD-10-CM | POA: Diagnosis not present

## 2017-03-20 DIAGNOSIS — Z7901 Long term (current) use of anticoagulants: Secondary | ICD-10-CM | POA: Diagnosis not present

## 2017-03-20 DIAGNOSIS — Z79899 Other long term (current) drug therapy: Secondary | ICD-10-CM | POA: Diagnosis not present

## 2017-03-20 DIAGNOSIS — Z7982 Long term (current) use of aspirin: Secondary | ICD-10-CM | POA: Diagnosis not present

## 2017-03-20 DIAGNOSIS — E039 Hypothyroidism, unspecified: Secondary | ICD-10-CM | POA: Diagnosis not present

## 2017-03-26 DIAGNOSIS — M170 Bilateral primary osteoarthritis of knee: Secondary | ICD-10-CM | POA: Diagnosis not present

## 2017-04-09 DIAGNOSIS — M950 Acquired deformity of nose: Secondary | ICD-10-CM | POA: Diagnosis not present

## 2017-04-09 DIAGNOSIS — Z7982 Long term (current) use of aspirin: Secondary | ICD-10-CM | POA: Diagnosis not present

## 2017-04-09 DIAGNOSIS — Z48810 Encounter for surgical aftercare following surgery on the sense organs: Secondary | ICD-10-CM | POA: Diagnosis not present

## 2017-04-09 DIAGNOSIS — Z885 Allergy status to narcotic agent status: Secondary | ICD-10-CM | POA: Diagnosis not present

## 2017-04-09 DIAGNOSIS — Z881 Allergy status to other antibiotic agents status: Secondary | ICD-10-CM | POA: Diagnosis not present

## 2017-04-09 DIAGNOSIS — I10 Essential (primary) hypertension: Secondary | ICD-10-CM | POA: Diagnosis not present

## 2017-04-09 DIAGNOSIS — Z87891 Personal history of nicotine dependence: Secondary | ICD-10-CM | POA: Diagnosis not present

## 2017-04-09 DIAGNOSIS — Z888 Allergy status to other drugs, medicaments and biological substances status: Secondary | ICD-10-CM | POA: Diagnosis not present

## 2017-04-09 DIAGNOSIS — Z79899 Other long term (current) drug therapy: Secondary | ICD-10-CM | POA: Diagnosis not present

## 2017-04-09 DIAGNOSIS — Z9889 Other specified postprocedural states: Secondary | ICD-10-CM | POA: Diagnosis not present

## 2017-04-09 DIAGNOSIS — I481 Persistent atrial fibrillation: Secondary | ICD-10-CM | POA: Diagnosis not present

## 2017-04-09 DIAGNOSIS — Z7901 Long term (current) use of anticoagulants: Secondary | ICD-10-CM | POA: Diagnosis not present

## 2017-04-09 DIAGNOSIS — Z880 Allergy status to penicillin: Secondary | ICD-10-CM | POA: Diagnosis not present

## 2017-04-21 DIAGNOSIS — E039 Hypothyroidism, unspecified: Secondary | ICD-10-CM | POA: Diagnosis not present

## 2017-04-21 DIAGNOSIS — R635 Abnormal weight gain: Secondary | ICD-10-CM | POA: Diagnosis not present

## 2017-04-21 DIAGNOSIS — R946 Abnormal results of thyroid function studies: Secondary | ICD-10-CM | POA: Diagnosis not present

## 2017-04-21 DIAGNOSIS — E782 Mixed hyperlipidemia: Secondary | ICD-10-CM | POA: Diagnosis not present

## 2017-04-21 DIAGNOSIS — Z85828 Personal history of other malignant neoplasm of skin: Secondary | ICD-10-CM | POA: Diagnosis not present

## 2017-04-21 DIAGNOSIS — I10 Essential (primary) hypertension: Secondary | ICD-10-CM | POA: Diagnosis not present

## 2017-04-21 DIAGNOSIS — I482 Chronic atrial fibrillation: Secondary | ICD-10-CM | POA: Diagnosis not present

## 2017-04-21 DIAGNOSIS — R928 Other abnormal and inconclusive findings on diagnostic imaging of breast: Secondary | ICD-10-CM | POA: Diagnosis not present

## 2017-04-24 DIAGNOSIS — E039 Hypothyroidism, unspecified: Secondary | ICD-10-CM | POA: Diagnosis not present

## 2017-04-24 DIAGNOSIS — I10 Essential (primary) hypertension: Secondary | ICD-10-CM | POA: Diagnosis not present

## 2017-04-24 DIAGNOSIS — Z85828 Personal history of other malignant neoplasm of skin: Secondary | ICD-10-CM | POA: Diagnosis not present

## 2017-04-24 DIAGNOSIS — K219 Gastro-esophageal reflux disease without esophagitis: Secondary | ICD-10-CM | POA: Diagnosis not present

## 2017-04-24 DIAGNOSIS — I482 Chronic atrial fibrillation: Secondary | ICD-10-CM | POA: Diagnosis not present

## 2017-04-24 DIAGNOSIS — Z23 Encounter for immunization: Secondary | ICD-10-CM | POA: Diagnosis not present

## 2017-04-24 DIAGNOSIS — E782 Mixed hyperlipidemia: Secondary | ICD-10-CM | POA: Diagnosis not present

## 2017-06-04 DIAGNOSIS — E039 Hypothyroidism, unspecified: Secondary | ICD-10-CM | POA: Diagnosis not present

## 2017-06-05 DIAGNOSIS — Z23 Encounter for immunization: Secondary | ICD-10-CM | POA: Diagnosis not present

## 2017-06-13 DIAGNOSIS — R4921 Hypernasality: Secondary | ICD-10-CM | POA: Diagnosis not present

## 2017-06-13 DIAGNOSIS — K1120 Sialoadenitis, unspecified: Secondary | ICD-10-CM | POA: Diagnosis not present

## 2017-06-13 DIAGNOSIS — M950 Acquired deformity of nose: Secondary | ICD-10-CM | POA: Diagnosis not present

## 2017-06-13 DIAGNOSIS — Z87891 Personal history of nicotine dependence: Secondary | ICD-10-CM | POA: Diagnosis not present

## 2017-06-17 DIAGNOSIS — C300 Malignant neoplasm of nasal cavity: Secondary | ICD-10-CM | POA: Diagnosis not present

## 2017-07-09 DIAGNOSIS — M170 Bilateral primary osteoarthritis of knee: Secondary | ICD-10-CM | POA: Diagnosis not present

## 2017-09-11 DIAGNOSIS — E782 Mixed hyperlipidemia: Secondary | ICD-10-CM | POA: Diagnosis not present

## 2017-09-11 DIAGNOSIS — K219 Gastro-esophageal reflux disease without esophagitis: Secondary | ICD-10-CM | POA: Diagnosis not present

## 2017-09-11 DIAGNOSIS — I482 Chronic atrial fibrillation: Secondary | ICD-10-CM | POA: Diagnosis not present

## 2017-09-11 DIAGNOSIS — E039 Hypothyroidism, unspecified: Secondary | ICD-10-CM | POA: Diagnosis not present

## 2017-09-11 DIAGNOSIS — I10 Essential (primary) hypertension: Secondary | ICD-10-CM | POA: Diagnosis not present

## 2017-09-16 DIAGNOSIS — I482 Chronic atrial fibrillation: Secondary | ICD-10-CM | POA: Diagnosis not present

## 2017-09-16 DIAGNOSIS — J019 Acute sinusitis, unspecified: Secondary | ICD-10-CM | POA: Diagnosis not present

## 2017-09-16 DIAGNOSIS — I10 Essential (primary) hypertension: Secondary | ICD-10-CM | POA: Diagnosis not present

## 2017-09-16 DIAGNOSIS — K219 Gastro-esophageal reflux disease without esophagitis: Secondary | ICD-10-CM | POA: Diagnosis not present

## 2017-09-16 DIAGNOSIS — E782 Mixed hyperlipidemia: Secondary | ICD-10-CM | POA: Diagnosis not present

## 2017-09-16 DIAGNOSIS — Z85828 Personal history of other malignant neoplasm of skin: Secondary | ICD-10-CM | POA: Diagnosis not present

## 2017-09-16 DIAGNOSIS — E039 Hypothyroidism, unspecified: Secondary | ICD-10-CM | POA: Diagnosis not present

## 2017-09-16 DIAGNOSIS — Z23 Encounter for immunization: Secondary | ICD-10-CM | POA: Diagnosis not present

## 2017-10-13 DIAGNOSIS — R4921 Hypernasality: Secondary | ICD-10-CM | POA: Diagnosis not present

## 2017-10-13 DIAGNOSIS — M950 Acquired deformity of nose: Secondary | ICD-10-CM | POA: Diagnosis not present

## 2017-10-28 DIAGNOSIS — E039 Hypothyroidism, unspecified: Secondary | ICD-10-CM | POA: Diagnosis not present

## 2017-11-25 DIAGNOSIS — M170 Bilateral primary osteoarthritis of knee: Secondary | ICD-10-CM | POA: Diagnosis not present

## 2017-11-25 DIAGNOSIS — M25561 Pain in right knee: Secondary | ICD-10-CM | POA: Diagnosis not present

## 2017-11-25 DIAGNOSIS — M25562 Pain in left knee: Secondary | ICD-10-CM | POA: Diagnosis not present

## 2018-01-12 DIAGNOSIS — Z9689 Presence of other specified functional implants: Secondary | ICD-10-CM | POA: Diagnosis not present

## 2018-01-12 DIAGNOSIS — M950 Acquired deformity of nose: Secondary | ICD-10-CM | POA: Diagnosis not present

## 2018-01-12 DIAGNOSIS — Z859 Personal history of malignant neoplasm, unspecified: Secondary | ICD-10-CM | POA: Diagnosis not present

## 2018-01-12 DIAGNOSIS — Z48810 Encounter for surgical aftercare following surgery on the sense organs: Secondary | ICD-10-CM | POA: Diagnosis not present

## 2018-01-29 DIAGNOSIS — E782 Mixed hyperlipidemia: Secondary | ICD-10-CM | POA: Diagnosis not present

## 2018-01-29 DIAGNOSIS — I10 Essential (primary) hypertension: Secondary | ICD-10-CM | POA: Diagnosis not present

## 2018-01-29 DIAGNOSIS — R946 Abnormal results of thyroid function studies: Secondary | ICD-10-CM | POA: Diagnosis not present

## 2018-01-29 DIAGNOSIS — K219 Gastro-esophageal reflux disease without esophagitis: Secondary | ICD-10-CM | POA: Diagnosis not present

## 2018-01-29 DIAGNOSIS — E039 Hypothyroidism, unspecified: Secondary | ICD-10-CM | POA: Diagnosis not present

## 2018-01-29 DIAGNOSIS — I4891 Unspecified atrial fibrillation: Secondary | ICD-10-CM | POA: Diagnosis not present

## 2018-01-29 DIAGNOSIS — Z85828 Personal history of other malignant neoplasm of skin: Secondary | ICD-10-CM | POA: Diagnosis not present

## 2018-02-02 DIAGNOSIS — E039 Hypothyroidism, unspecified: Secondary | ICD-10-CM | POA: Diagnosis not present

## 2018-02-02 DIAGNOSIS — Z6841 Body Mass Index (BMI) 40.0 and over, adult: Secondary | ICD-10-CM | POA: Diagnosis not present

## 2018-02-02 DIAGNOSIS — K219 Gastro-esophageal reflux disease without esophagitis: Secondary | ICD-10-CM | POA: Diagnosis not present

## 2018-02-02 DIAGNOSIS — I10 Essential (primary) hypertension: Secondary | ICD-10-CM | POA: Diagnosis not present

## 2018-02-02 DIAGNOSIS — I482 Chronic atrial fibrillation: Secondary | ICD-10-CM | POA: Diagnosis not present

## 2018-02-02 DIAGNOSIS — Z85828 Personal history of other malignant neoplasm of skin: Secondary | ICD-10-CM | POA: Diagnosis not present

## 2018-02-02 DIAGNOSIS — Z0001 Encounter for general adult medical examination with abnormal findings: Secondary | ICD-10-CM | POA: Diagnosis not present

## 2018-02-02 DIAGNOSIS — E782 Mixed hyperlipidemia: Secondary | ICD-10-CM | POA: Diagnosis not present

## 2018-02-19 DIAGNOSIS — Z7901 Long term (current) use of anticoagulants: Secondary | ICD-10-CM | POA: Diagnosis not present

## 2018-02-19 DIAGNOSIS — Z7982 Long term (current) use of aspirin: Secondary | ICD-10-CM | POA: Diagnosis not present

## 2018-02-19 DIAGNOSIS — I481 Persistent atrial fibrillation: Secondary | ICD-10-CM | POA: Diagnosis not present

## 2018-02-19 DIAGNOSIS — Z87891 Personal history of nicotine dependence: Secondary | ICD-10-CM | POA: Diagnosis not present

## 2018-02-19 DIAGNOSIS — C44310 Basal cell carcinoma of skin of unspecified parts of face: Secondary | ICD-10-CM | POA: Diagnosis not present

## 2018-03-04 DIAGNOSIS — R928 Other abnormal and inconclusive findings on diagnostic imaging of breast: Secondary | ICD-10-CM | POA: Diagnosis not present

## 2018-03-04 DIAGNOSIS — N6002 Solitary cyst of left breast: Secondary | ICD-10-CM | POA: Diagnosis not present

## 2018-03-11 DIAGNOSIS — M170 Bilateral primary osteoarthritis of knee: Secondary | ICD-10-CM | POA: Diagnosis not present

## 2018-05-21 DIAGNOSIS — H25813 Combined forms of age-related cataract, bilateral: Secondary | ICD-10-CM | POA: Diagnosis not present

## 2018-05-21 DIAGNOSIS — H04123 Dry eye syndrome of bilateral lacrimal glands: Secondary | ICD-10-CM | POA: Diagnosis not present

## 2018-06-11 DIAGNOSIS — M170 Bilateral primary osteoarthritis of knee: Secondary | ICD-10-CM | POA: Diagnosis not present

## 2018-06-11 DIAGNOSIS — M25562 Pain in left knee: Secondary | ICD-10-CM | POA: Diagnosis not present

## 2018-06-11 DIAGNOSIS — M25561 Pain in right knee: Secondary | ICD-10-CM | POA: Diagnosis not present

## 2018-07-13 DIAGNOSIS — M950 Acquired deformity of nose: Secondary | ICD-10-CM | POA: Diagnosis not present

## 2018-07-20 DIAGNOSIS — M950 Acquired deformity of nose: Secondary | ICD-10-CM | POA: Diagnosis not present

## 2018-07-21 DIAGNOSIS — E039 Hypothyroidism, unspecified: Secondary | ICD-10-CM | POA: Diagnosis not present

## 2018-07-21 DIAGNOSIS — R739 Hyperglycemia, unspecified: Secondary | ICD-10-CM | POA: Diagnosis not present

## 2018-07-21 DIAGNOSIS — I10 Essential (primary) hypertension: Secondary | ICD-10-CM | POA: Diagnosis not present

## 2018-07-21 DIAGNOSIS — E782 Mixed hyperlipidemia: Secondary | ICD-10-CM | POA: Diagnosis not present

## 2018-07-21 DIAGNOSIS — R946 Abnormal results of thyroid function studies: Secondary | ICD-10-CM | POA: Diagnosis not present

## 2018-07-21 DIAGNOSIS — I4891 Unspecified atrial fibrillation: Secondary | ICD-10-CM | POA: Diagnosis not present

## 2018-07-21 DIAGNOSIS — K219 Gastro-esophageal reflux disease without esophagitis: Secondary | ICD-10-CM | POA: Diagnosis not present

## 2018-07-23 DIAGNOSIS — K219 Gastro-esophageal reflux disease without esophagitis: Secondary | ICD-10-CM | POA: Diagnosis not present

## 2018-07-23 DIAGNOSIS — E782 Mixed hyperlipidemia: Secondary | ICD-10-CM | POA: Diagnosis not present

## 2018-07-23 DIAGNOSIS — I10 Essential (primary) hypertension: Secondary | ICD-10-CM | POA: Diagnosis not present

## 2018-07-23 DIAGNOSIS — I4891 Unspecified atrial fibrillation: Secondary | ICD-10-CM | POA: Diagnosis not present

## 2018-07-23 DIAGNOSIS — Z23 Encounter for immunization: Secondary | ICD-10-CM | POA: Diagnosis not present

## 2018-07-23 DIAGNOSIS — Z6841 Body Mass Index (BMI) 40.0 and over, adult: Secondary | ICD-10-CM | POA: Diagnosis not present

## 2018-07-23 DIAGNOSIS — E039 Hypothyroidism, unspecified: Secondary | ICD-10-CM | POA: Diagnosis not present

## 2018-07-23 DIAGNOSIS — Z85828 Personal history of other malignant neoplasm of skin: Secondary | ICD-10-CM | POA: Diagnosis not present

## 2018-09-03 DIAGNOSIS — E039 Hypothyroidism, unspecified: Secondary | ICD-10-CM | POA: Diagnosis not present

## 2018-09-09 DIAGNOSIS — M170 Bilateral primary osteoarthritis of knee: Secondary | ICD-10-CM | POA: Diagnosis not present

## 2018-09-09 DIAGNOSIS — Z4789 Encounter for other orthopedic aftercare: Secondary | ICD-10-CM | POA: Diagnosis not present

## 2018-09-18 DIAGNOSIS — M170 Bilateral primary osteoarthritis of knee: Secondary | ICD-10-CM | POA: Diagnosis not present

## 2018-10-05 DIAGNOSIS — H25813 Combined forms of age-related cataract, bilateral: Secondary | ICD-10-CM | POA: Diagnosis not present

## 2018-12-15 DIAGNOSIS — E039 Hypothyroidism, unspecified: Secondary | ICD-10-CM | POA: Diagnosis not present

## 2018-12-15 DIAGNOSIS — K219 Gastro-esophageal reflux disease without esophagitis: Secondary | ICD-10-CM | POA: Diagnosis not present

## 2018-12-15 DIAGNOSIS — E782 Mixed hyperlipidemia: Secondary | ICD-10-CM | POA: Diagnosis not present

## 2018-12-15 DIAGNOSIS — I10 Essential (primary) hypertension: Secondary | ICD-10-CM | POA: Diagnosis not present

## 2018-12-15 DIAGNOSIS — R946 Abnormal results of thyroid function studies: Secondary | ICD-10-CM | POA: Diagnosis not present

## 2018-12-15 DIAGNOSIS — I4891 Unspecified atrial fibrillation: Secondary | ICD-10-CM | POA: Diagnosis not present

## 2018-12-18 DIAGNOSIS — I10 Essential (primary) hypertension: Secondary | ICD-10-CM | POA: Diagnosis not present

## 2018-12-18 DIAGNOSIS — E782 Mixed hyperlipidemia: Secondary | ICD-10-CM | POA: Diagnosis not present

## 2018-12-18 DIAGNOSIS — I4891 Unspecified atrial fibrillation: Secondary | ICD-10-CM | POA: Diagnosis not present

## 2018-12-18 DIAGNOSIS — Z85828 Personal history of other malignant neoplasm of skin: Secondary | ICD-10-CM | POA: Diagnosis not present

## 2018-12-18 DIAGNOSIS — Z6841 Body Mass Index (BMI) 40.0 and over, adult: Secondary | ICD-10-CM | POA: Diagnosis not present

## 2018-12-18 DIAGNOSIS — E039 Hypothyroidism, unspecified: Secondary | ICD-10-CM | POA: Diagnosis not present

## 2018-12-18 DIAGNOSIS — K219 Gastro-esophageal reflux disease without esophagitis: Secondary | ICD-10-CM | POA: Diagnosis not present

## 2019-02-18 DIAGNOSIS — Z7901 Long term (current) use of anticoagulants: Secondary | ICD-10-CM | POA: Diagnosis not present

## 2019-02-18 DIAGNOSIS — C44310 Basal cell carcinoma of skin of unspecified parts of face: Secondary | ICD-10-CM | POA: Diagnosis not present

## 2019-02-18 DIAGNOSIS — Z87891 Personal history of nicotine dependence: Secondary | ICD-10-CM | POA: Diagnosis not present

## 2019-02-18 DIAGNOSIS — Z8589 Personal history of malignant neoplasm of other organs and systems: Secondary | ICD-10-CM | POA: Diagnosis not present

## 2019-02-18 DIAGNOSIS — Z79899 Other long term (current) drug therapy: Secondary | ICD-10-CM | POA: Diagnosis not present

## 2019-02-18 DIAGNOSIS — I4811 Longstanding persistent atrial fibrillation: Secondary | ICD-10-CM | POA: Diagnosis not present

## 2019-04-05 ENCOUNTER — Other Ambulatory Visit: Payer: Self-pay

## 2019-05-04 DIAGNOSIS — M170 Bilateral primary osteoarthritis of knee: Secondary | ICD-10-CM | POA: Diagnosis not present

## 2019-07-16 DIAGNOSIS — Z23 Encounter for immunization: Secondary | ICD-10-CM | POA: Diagnosis not present

## 2019-08-09 DIAGNOSIS — M950 Acquired deformity of nose: Secondary | ICD-10-CM | POA: Diagnosis not present

## 2019-08-09 DIAGNOSIS — J0101 Acute recurrent maxillary sinusitis: Secondary | ICD-10-CM | POA: Diagnosis not present

## 2019-09-23 DIAGNOSIS — M170 Bilateral primary osteoarthritis of knee: Secondary | ICD-10-CM | POA: Diagnosis not present

## 2019-09-24 DIAGNOSIS — E782 Mixed hyperlipidemia: Secondary | ICD-10-CM | POA: Diagnosis not present

## 2019-09-24 DIAGNOSIS — E039 Hypothyroidism, unspecified: Secondary | ICD-10-CM | POA: Diagnosis not present

## 2019-09-24 DIAGNOSIS — K219 Gastro-esophageal reflux disease without esophagitis: Secondary | ICD-10-CM | POA: Diagnosis not present

## 2019-09-24 DIAGNOSIS — I10 Essential (primary) hypertension: Secondary | ICD-10-CM | POA: Diagnosis not present

## 2019-10-04 DIAGNOSIS — I10 Essential (primary) hypertension: Secondary | ICD-10-CM | POA: Diagnosis not present

## 2019-10-04 DIAGNOSIS — E039 Hypothyroidism, unspecified: Secondary | ICD-10-CM | POA: Diagnosis not present

## 2019-10-04 DIAGNOSIS — Z85828 Personal history of other malignant neoplasm of skin: Secondary | ICD-10-CM | POA: Diagnosis not present

## 2019-10-04 DIAGNOSIS — E782 Mixed hyperlipidemia: Secondary | ICD-10-CM | POA: Diagnosis not present

## 2019-10-04 DIAGNOSIS — Z6841 Body Mass Index (BMI) 40.0 and over, adult: Secondary | ICD-10-CM | POA: Diagnosis not present

## 2019-10-13 DIAGNOSIS — Z9889 Other specified postprocedural states: Secondary | ICD-10-CM | POA: Diagnosis not present

## 2019-10-13 DIAGNOSIS — M2352 Chronic instability of knee, left knee: Secondary | ICD-10-CM | POA: Diagnosis not present

## 2019-10-13 DIAGNOSIS — M170 Bilateral primary osteoarthritis of knee: Secondary | ICD-10-CM | POA: Diagnosis not present

## 2019-10-13 DIAGNOSIS — M2351 Chronic instability of knee, right knee: Secondary | ICD-10-CM | POA: Diagnosis not present

## 2019-10-13 DIAGNOSIS — R29898 Other symptoms and signs involving the musculoskeletal system: Secondary | ICD-10-CM | POA: Diagnosis not present

## 2019-10-28 DIAGNOSIS — Z7409 Other reduced mobility: Secondary | ICD-10-CM | POA: Diagnosis not present

## 2019-10-28 DIAGNOSIS — M170 Bilateral primary osteoarthritis of knee: Secondary | ICD-10-CM | POA: Diagnosis not present

## 2019-12-24 DIAGNOSIS — Z8522 Personal history of malignant neoplasm of nasal cavities, middle ear, and accessory sinuses: Secondary | ICD-10-CM | POA: Diagnosis not present

## 2019-12-24 DIAGNOSIS — Z9889 Other specified postprocedural states: Secondary | ICD-10-CM | POA: Diagnosis not present

## 2019-12-24 DIAGNOSIS — J018 Other acute sinusitis: Secondary | ICD-10-CM | POA: Diagnosis not present

## 2019-12-24 DIAGNOSIS — M898X8 Other specified disorders of bone, other site: Secondary | ICD-10-CM | POA: Diagnosis not present

## 2019-12-24 DIAGNOSIS — M950 Acquired deformity of nose: Secondary | ICD-10-CM | POA: Diagnosis not present

## 2019-12-24 DIAGNOSIS — J3489 Other specified disorders of nose and nasal sinuses: Secondary | ICD-10-CM | POA: Diagnosis not present

## 2019-12-24 DIAGNOSIS — Z87891 Personal history of nicotine dependence: Secondary | ICD-10-CM | POA: Diagnosis not present

## 2020-03-16 DIAGNOSIS — E039 Hypothyroidism, unspecified: Secondary | ICD-10-CM | POA: Diagnosis not present

## 2020-03-16 DIAGNOSIS — E782 Mixed hyperlipidemia: Secondary | ICD-10-CM | POA: Diagnosis not present

## 2020-03-16 DIAGNOSIS — K219 Gastro-esophageal reflux disease without esophagitis: Secondary | ICD-10-CM | POA: Diagnosis not present

## 2020-03-16 DIAGNOSIS — Z85828 Personal history of other malignant neoplasm of skin: Secondary | ICD-10-CM | POA: Diagnosis not present

## 2020-03-16 DIAGNOSIS — Z0001 Encounter for general adult medical examination with abnormal findings: Secondary | ICD-10-CM | POA: Diagnosis not present

## 2020-03-16 DIAGNOSIS — I10 Essential (primary) hypertension: Secondary | ICD-10-CM | POA: Diagnosis not present

## 2020-03-23 DIAGNOSIS — C44310 Basal cell carcinoma of skin of unspecified parts of face: Secondary | ICD-10-CM | POA: Diagnosis not present

## 2020-03-23 DIAGNOSIS — I4811 Longstanding persistent atrial fibrillation: Secondary | ICD-10-CM | POA: Diagnosis not present

## 2020-03-23 DIAGNOSIS — R799 Abnormal finding of blood chemistry, unspecified: Secondary | ICD-10-CM | POA: Diagnosis not present

## 2020-03-23 DIAGNOSIS — Z87891 Personal history of nicotine dependence: Secondary | ICD-10-CM | POA: Diagnosis not present

## 2020-03-23 DIAGNOSIS — Z79899 Other long term (current) drug therapy: Secondary | ICD-10-CM | POA: Diagnosis not present

## 2020-03-23 DIAGNOSIS — I10 Essential (primary) hypertension: Secondary | ICD-10-CM | POA: Diagnosis not present

## 2020-04-17 DIAGNOSIS — M170 Bilateral primary osteoarthritis of knee: Secondary | ICD-10-CM | POA: Diagnosis not present

## 2020-05-17 DIAGNOSIS — M170 Bilateral primary osteoarthritis of knee: Secondary | ICD-10-CM | POA: Diagnosis not present

## 2020-06-12 DIAGNOSIS — M950 Acquired deformity of nose: Secondary | ICD-10-CM | POA: Diagnosis not present

## 2020-06-28 DIAGNOSIS — Z23 Encounter for immunization: Secondary | ICD-10-CM | POA: Diagnosis not present

## 2020-06-28 DIAGNOSIS — M170 Bilateral primary osteoarthritis of knee: Secondary | ICD-10-CM | POA: Diagnosis not present

## 2020-09-14 DIAGNOSIS — E782 Mixed hyperlipidemia: Secondary | ICD-10-CM | POA: Diagnosis not present

## 2020-09-14 DIAGNOSIS — E7849 Other hyperlipidemia: Secondary | ICD-10-CM | POA: Diagnosis not present

## 2020-09-14 DIAGNOSIS — R928 Other abnormal and inconclusive findings on diagnostic imaging of breast: Secondary | ICD-10-CM | POA: Diagnosis not present

## 2020-09-14 DIAGNOSIS — K219 Gastro-esophageal reflux disease without esophagitis: Secondary | ICD-10-CM | POA: Diagnosis not present

## 2020-09-14 DIAGNOSIS — I10 Essential (primary) hypertension: Secondary | ICD-10-CM | POA: Diagnosis not present

## 2020-09-14 DIAGNOSIS — E039 Hypothyroidism, unspecified: Secondary | ICD-10-CM | POA: Diagnosis not present

## 2020-10-04 DIAGNOSIS — I10 Essential (primary) hypertension: Secondary | ICD-10-CM | POA: Diagnosis not present

## 2020-10-04 DIAGNOSIS — E7849 Other hyperlipidemia: Secondary | ICD-10-CM | POA: Diagnosis not present

## 2020-10-04 DIAGNOSIS — Z85828 Personal history of other malignant neoplasm of skin: Secondary | ICD-10-CM | POA: Diagnosis not present

## 2020-10-04 DIAGNOSIS — K219 Gastro-esophageal reflux disease without esophagitis: Secondary | ICD-10-CM | POA: Diagnosis not present

## 2020-10-04 DIAGNOSIS — E039 Hypothyroidism, unspecified: Secondary | ICD-10-CM | POA: Diagnosis not present

## 2021-02-05 DIAGNOSIS — I10 Essential (primary) hypertension: Secondary | ICD-10-CM | POA: Diagnosis not present

## 2021-02-05 DIAGNOSIS — E039 Hypothyroidism, unspecified: Secondary | ICD-10-CM | POA: Diagnosis not present

## 2021-02-05 DIAGNOSIS — K219 Gastro-esophageal reflux disease without esophagitis: Secondary | ICD-10-CM | POA: Diagnosis not present

## 2021-02-05 DIAGNOSIS — R946 Abnormal results of thyroid function studies: Secondary | ICD-10-CM | POA: Diagnosis not present

## 2021-02-05 DIAGNOSIS — E7849 Other hyperlipidemia: Secondary | ICD-10-CM | POA: Diagnosis not present

## 2021-02-05 DIAGNOSIS — E782 Mixed hyperlipidemia: Secondary | ICD-10-CM | POA: Diagnosis not present

## 2021-02-06 DIAGNOSIS — M170 Bilateral primary osteoarthritis of knee: Secondary | ICD-10-CM | POA: Diagnosis not present

## 2021-02-09 DIAGNOSIS — I10 Essential (primary) hypertension: Secondary | ICD-10-CM | POA: Diagnosis not present

## 2021-02-09 DIAGNOSIS — Z85828 Personal history of other malignant neoplasm of skin: Secondary | ICD-10-CM | POA: Diagnosis not present

## 2021-02-09 DIAGNOSIS — Z6841 Body Mass Index (BMI) 40.0 and over, adult: Secondary | ICD-10-CM | POA: Diagnosis not present

## 2021-02-09 DIAGNOSIS — E039 Hypothyroidism, unspecified: Secondary | ICD-10-CM | POA: Diagnosis not present

## 2021-02-09 DIAGNOSIS — E7849 Other hyperlipidemia: Secondary | ICD-10-CM | POA: Diagnosis not present

## 2021-02-09 DIAGNOSIS — K219 Gastro-esophageal reflux disease without esophagitis: Secondary | ICD-10-CM | POA: Diagnosis not present

## 2021-03-13 DIAGNOSIS — Z20822 Contact with and (suspected) exposure to covid-19: Secondary | ICD-10-CM | POA: Diagnosis not present

## 2021-03-19 DIAGNOSIS — Z886 Allergy status to analgesic agent status: Secondary | ICD-10-CM | POA: Diagnosis not present

## 2021-03-19 DIAGNOSIS — Z87891 Personal history of nicotine dependence: Secondary | ICD-10-CM | POA: Diagnosis not present

## 2021-03-19 DIAGNOSIS — J018 Other acute sinusitis: Secondary | ICD-10-CM | POA: Diagnosis not present

## 2021-03-19 DIAGNOSIS — Z888 Allergy status to other drugs, medicaments and biological substances status: Secondary | ICD-10-CM | POA: Diagnosis not present

## 2021-03-19 DIAGNOSIS — Z91040 Latex allergy status: Secondary | ICD-10-CM | POA: Diagnosis not present

## 2021-03-19 DIAGNOSIS — M950 Acquired deformity of nose: Secondary | ICD-10-CM | POA: Diagnosis not present

## 2021-03-19 DIAGNOSIS — Z882 Allergy status to sulfonamides status: Secondary | ICD-10-CM | POA: Diagnosis not present

## 2021-03-19 DIAGNOSIS — Z885 Allergy status to narcotic agent status: Secondary | ICD-10-CM | POA: Diagnosis not present

## 2021-03-19 DIAGNOSIS — Z881 Allergy status to other antibiotic agents status: Secondary | ICD-10-CM | POA: Diagnosis not present

## 2021-03-19 DIAGNOSIS — Z880 Allergy status to penicillin: Secondary | ICD-10-CM | POA: Diagnosis not present

## 2021-03-22 DIAGNOSIS — I4811 Longstanding persistent atrial fibrillation: Secondary | ICD-10-CM | POA: Diagnosis not present

## 2021-03-22 DIAGNOSIS — C44310 Basal cell carcinoma of skin of unspecified parts of face: Secondary | ICD-10-CM | POA: Diagnosis not present

## 2021-03-22 DIAGNOSIS — Z87891 Personal history of nicotine dependence: Secondary | ICD-10-CM | POA: Diagnosis not present

## 2021-03-22 DIAGNOSIS — Z7901 Long term (current) use of anticoagulants: Secondary | ICD-10-CM | POA: Diagnosis not present

## 2021-03-22 DIAGNOSIS — I10 Essential (primary) hypertension: Secondary | ICD-10-CM | POA: Diagnosis not present

## 2021-03-26 DIAGNOSIS — J329 Chronic sinusitis, unspecified: Secondary | ICD-10-CM | POA: Diagnosis not present

## 2021-03-26 DIAGNOSIS — J018 Other acute sinusitis: Secondary | ICD-10-CM | POA: Diagnosis not present

## 2021-06-06 DIAGNOSIS — M170 Bilateral primary osteoarthritis of knee: Secondary | ICD-10-CM | POA: Diagnosis not present

## 2021-06-06 DIAGNOSIS — M25561 Pain in right knee: Secondary | ICD-10-CM | POA: Diagnosis not present

## 2021-06-06 DIAGNOSIS — M25562 Pain in left knee: Secondary | ICD-10-CM | POA: Diagnosis not present

## 2021-06-15 DIAGNOSIS — E039 Hypothyroidism, unspecified: Secondary | ICD-10-CM | POA: Diagnosis not present

## 2021-06-15 DIAGNOSIS — E7849 Other hyperlipidemia: Secondary | ICD-10-CM | POA: Diagnosis not present

## 2021-06-15 DIAGNOSIS — E782 Mixed hyperlipidemia: Secondary | ICD-10-CM | POA: Diagnosis not present

## 2021-06-15 DIAGNOSIS — K219 Gastro-esophageal reflux disease without esophagitis: Secondary | ICD-10-CM | POA: Diagnosis not present

## 2021-06-15 DIAGNOSIS — I10 Essential (primary) hypertension: Secondary | ICD-10-CM | POA: Diagnosis not present

## 2021-06-20 DIAGNOSIS — Z23 Encounter for immunization: Secondary | ICD-10-CM | POA: Diagnosis not present

## 2021-06-20 DIAGNOSIS — E039 Hypothyroidism, unspecified: Secondary | ICD-10-CM | POA: Diagnosis not present

## 2021-06-20 DIAGNOSIS — Z6841 Body Mass Index (BMI) 40.0 and over, adult: Secondary | ICD-10-CM | POA: Diagnosis not present

## 2021-06-20 DIAGNOSIS — Z85828 Personal history of other malignant neoplasm of skin: Secondary | ICD-10-CM | POA: Diagnosis not present

## 2021-06-20 DIAGNOSIS — K219 Gastro-esophageal reflux disease without esophagitis: Secondary | ICD-10-CM | POA: Diagnosis not present

## 2021-06-20 DIAGNOSIS — I10 Essential (primary) hypertension: Secondary | ICD-10-CM | POA: Diagnosis not present

## 2021-06-20 DIAGNOSIS — E7849 Other hyperlipidemia: Secondary | ICD-10-CM | POA: Diagnosis not present

## 2021-06-25 DIAGNOSIS — Z23 Encounter for immunization: Secondary | ICD-10-CM | POA: Diagnosis not present

## 2021-09-27 DIAGNOSIS — M170 Bilateral primary osteoarthritis of knee: Secondary | ICD-10-CM | POA: Diagnosis not present

## 2021-10-10 DIAGNOSIS — Z20822 Contact with and (suspected) exposure to covid-19: Secondary | ICD-10-CM | POA: Diagnosis not present

## 2021-10-17 DIAGNOSIS — E782 Mixed hyperlipidemia: Secondary | ICD-10-CM | POA: Diagnosis not present

## 2021-10-17 DIAGNOSIS — I10 Essential (primary) hypertension: Secondary | ICD-10-CM | POA: Diagnosis not present

## 2021-10-17 DIAGNOSIS — Z0001 Encounter for general adult medical examination with abnormal findings: Secondary | ICD-10-CM | POA: Diagnosis not present

## 2021-10-17 DIAGNOSIS — E7849 Other hyperlipidemia: Secondary | ICD-10-CM | POA: Diagnosis not present

## 2021-10-17 DIAGNOSIS — E039 Hypothyroidism, unspecified: Secondary | ICD-10-CM | POA: Diagnosis not present

## 2021-10-22 DIAGNOSIS — E7849 Other hyperlipidemia: Secondary | ICD-10-CM | POA: Diagnosis not present

## 2021-10-22 DIAGNOSIS — I10 Essential (primary) hypertension: Secondary | ICD-10-CM | POA: Diagnosis not present

## 2021-10-22 DIAGNOSIS — E039 Hypothyroidism, unspecified: Secondary | ICD-10-CM | POA: Diagnosis not present

## 2021-10-22 DIAGNOSIS — I482 Chronic atrial fibrillation, unspecified: Secondary | ICD-10-CM | POA: Diagnosis not present

## 2021-10-22 DIAGNOSIS — K219 Gastro-esophageal reflux disease without esophagitis: Secondary | ICD-10-CM | POA: Diagnosis not present

## 2021-10-22 DIAGNOSIS — Z85828 Personal history of other malignant neoplasm of skin: Secondary | ICD-10-CM | POA: Diagnosis not present

## 2021-12-12 DIAGNOSIS — M170 Bilateral primary osteoarthritis of knee: Secondary | ICD-10-CM | POA: Diagnosis not present

## 2021-12-13 DIAGNOSIS — Z20822 Contact with and (suspected) exposure to covid-19: Secondary | ICD-10-CM | POA: Diagnosis not present

## 2022-01-05 DIAGNOSIS — Z20822 Contact with and (suspected) exposure to covid-19: Secondary | ICD-10-CM | POA: Diagnosis not present

## 2022-03-14 DIAGNOSIS — Z0001 Encounter for general adult medical examination with abnormal findings: Secondary | ICD-10-CM | POA: Diagnosis not present

## 2022-03-14 DIAGNOSIS — K219 Gastro-esophageal reflux disease without esophagitis: Secondary | ICD-10-CM | POA: Diagnosis not present

## 2022-03-14 DIAGNOSIS — E7849 Other hyperlipidemia: Secondary | ICD-10-CM | POA: Diagnosis not present

## 2022-03-14 DIAGNOSIS — E039 Hypothyroidism, unspecified: Secondary | ICD-10-CM | POA: Diagnosis not present

## 2022-03-14 DIAGNOSIS — I10 Essential (primary) hypertension: Secondary | ICD-10-CM | POA: Diagnosis not present

## 2022-03-14 DIAGNOSIS — R946 Abnormal results of thyroid function studies: Secondary | ICD-10-CM | POA: Diagnosis not present

## 2022-03-18 DIAGNOSIS — I482 Chronic atrial fibrillation, unspecified: Secondary | ICD-10-CM | POA: Diagnosis not present

## 2022-03-18 DIAGNOSIS — K219 Gastro-esophageal reflux disease without esophagitis: Secondary | ICD-10-CM | POA: Diagnosis not present

## 2022-03-18 DIAGNOSIS — I10 Essential (primary) hypertension: Secondary | ICD-10-CM | POA: Diagnosis not present

## 2022-03-18 DIAGNOSIS — E039 Hypothyroidism, unspecified: Secondary | ICD-10-CM | POA: Diagnosis not present

## 2022-03-18 DIAGNOSIS — Z85828 Personal history of other malignant neoplasm of skin: Secondary | ICD-10-CM | POA: Diagnosis not present

## 2022-03-18 DIAGNOSIS — E7849 Other hyperlipidemia: Secondary | ICD-10-CM | POA: Diagnosis not present

## 2022-05-09 ENCOUNTER — Encounter: Payer: Self-pay | Admitting: *Deleted

## 2022-05-10 ENCOUNTER — Ambulatory Visit: Payer: Medicare HMO | Admitting: Cardiology

## 2022-05-10 ENCOUNTER — Encounter: Payer: Self-pay | Admitting: Cardiology

## 2022-05-10 NOTE — Progress Notes (Deleted)
Cardiology Office Note  Date: 05/10/2022   ID: Robi, Mitter October 14, 1950, MRN [id number]  PCP:  Tracy Kaufman, PA-C  Cardiologist:  Rozann Lesches, MD Electrophysiologist:  None   No chief complaint on file.   History of Present Illness: Tracy Rivera is a 71 y.o. female referred for cardiology consultation by Ms. Skillman PA-C with [facility] for management of atrial fibrillation.  She was previously followed by Dr. Gelene Mink at [facility] for management of persistent atrial fibrillation with CHA2DS2-VASc score of 2.  She had previously been on flecainide, but this was discontinued due to persistent arrhythmia and it looks like she was managed with heart rate control strategy, also Eliquis for stroke prophylaxis.  Past Medical History:  Diagnosis Date   Atrial fibrillation ([place])    Essential hypertension    GERD (gastroesophageal reflux disease)    Hypothyroidism    Mixed hyperlipidemia    Skin cancer     Past Surgical History:  Procedure Laterality Date   ABDOMINAL HYSTERECTOMY     SKIN CANCER EXCISION     Multiple facial surgeries    Current Outpatient Medications  Medication Sig Dispense Refill   apixaban (ELIQUIS) 5 MG TABS tablet Take 5 mg by mouth daily.     aspirin EC 81 MG tablet Take 81 mg by mouth daily. Swallow whole.     metoprolol tartrate (LOPRESSOR) 100 MG tablet Take 50 mg by mouth 2 (two) times daily.     pantoprazole (PROTONIX) 40 MG tablet Take 40 mg by mouth daily.     polyethylene glycol (MIRALAX / GLYCOLAX) 17 g packet Take 17 g by mouth daily as needed.     No current facility-administered medications for this visit.   Allergies:  Propoxyphene, Celecoxib, Erythromycin, Lanolin, Latex, Naproxen, Other, Oxytetracycline, Penicillins, Prednisone, Tape, and Codeine   Social History: The patient  reports that she has never smoked. She has never used smokeless tobacco. She reports that she does not drink alcohol and does not use drugs.    Family History: The patient's family history includes Breast cancer in her mother; Diabetes in her father; Heart disease in her mother; Heart failure in her father.   ROS:  Please see the history of present illness. Otherwise, complete review of systems is positive for {NONE DEFAULTED:18576}.  All other systems are reviewed and negative.   Physical Exam: VS:  There were no vitals taken for this visit., BMI There is no height or weight on file to calculate BMI.  Wt Readings from Last 3 Encounters:  11/24/14 176 lb (79.8 kg)    General: Patient appears comfortable at rest. HEENT: Conjunctiva and lids normal, oropharynx clear with moist mucosa. Neck: Supple, no elevated JVP or carotid bruits, no thyromegaly. Lungs: Clear to auscultation, nonlabored breathing at rest. Cardiac: Regular rate and rhythm, no S3 or significant systolic murmur, no pericardial rub. Abdomen: Soft, nontender, no hepatomegaly, bowel sounds present, no guarding or rebound. Extremities: No pitting edema, distal pulses 2+. Skin: Warm and dry. Musculoskeletal: No kyphosis. Neuropsychiatric: Alert and oriented x3, affect grossly appropriate.  ECG:   No old tracing for personal review today.  Recent Labwork:  July 2023: Potassium 4.1, BUN 7, creatinine 0.87, AST 14, ALT 8, cholesterol 185, HDL 53, triglycerides 142, LDL 103, TSH 7.276, hemoglobin 13.8, platelets 273  Other Studies Reviewed Today:  Echocardiogram 04/09/2017 [facility]): SUMMARY The left ventricular size is normal.  Left ventricular systolic function is normal. LV ejection fraction = 55-60%. The right ventricle  is normal in size and function. The left atrium is mildly dilated. No significant stenosis or regurgitation seen Estimated right ventricular systolic pressure is 27 mmHg. Estimated right atrial pressure is 5 mmHg.Marland Kitchen There is no pericardial effusion. Compared with prior TTE, LV contractility is more vigrous  Assessment and  Plan:    Medication Adjustments/Labs and Tests Ordered: Current medicines are reviewed at length with the patient today.  Concerns regarding medicines are outlined above.   Tests Ordered: No orders of the defined types were placed in this encounter.   Medication Changes: No orders of the defined types were placed in this encounter.   Disposition:  Follow up {follow up:15908}  Signed, Satira Sark, MD, [facility] 05/10/2022 9:59 AM    [facility], [place], [place] [address] Phone: [phone]; Fax: [phone]

## 2022-05-15 DIAGNOSIS — Z87891 Personal history of nicotine dependence: Secondary | ICD-10-CM | POA: Diagnosis not present

## 2022-05-15 DIAGNOSIS — M8588 Other specified disorders of bone density and structure, other site: Secondary | ICD-10-CM | POA: Diagnosis not present

## 2022-05-15 DIAGNOSIS — I70208 Unspecified atherosclerosis of native arteries of extremities, other extremity: Secondary | ICD-10-CM | POA: Diagnosis not present

## 2022-05-15 DIAGNOSIS — M170 Bilateral primary osteoarthritis of knee: Secondary | ICD-10-CM | POA: Diagnosis not present

## 2022-05-31 DIAGNOSIS — M170 Bilateral primary osteoarthritis of knee: Secondary | ICD-10-CM | POA: Diagnosis not present

## 2022-06-10 NOTE — Progress Notes (Deleted)
Cardiology Office Note   Date:  06/10/2022   ID:  Tracy Rivera, Tracy Rivera 1951/05/17, MRN [id number]  PCP:  Rosalee Kaufman, PA-C  Cardiologist:   Rozann Lesches, MD Referring:  ***  No chief complaint on file.     History of Present Illness: Tracy Rivera is a 71 y.o. female who presents for evaluation of atrial fib.  ***     Past Medical History:  Diagnosis Date   Atrial fibrillation ([place])    Essential hypertension    GERD (gastroesophageal reflux disease)    Hypothyroidism    Mixed hyperlipidemia    Skin cancer     Past Surgical History:  Procedure Laterality Date   ABDOMINAL HYSTERECTOMY     SKIN CANCER EXCISION     Multiple facial surgeries     Current Outpatient Medications  Medication Sig Dispense Refill   apixaban (ELIQUIS) 5 MG TABS tablet Take 5 mg by mouth daily.     aspirin EC 81 MG tablet Take 81 mg by mouth daily. Swallow whole.     metoprolol tartrate (LOPRESSOR) 100 MG tablet Take 50 mg by mouth 2 (two) times daily.     pantoprazole (PROTONIX) 40 MG tablet Take 40 mg by mouth daily.     polyethylene glycol (MIRALAX / GLYCOLAX) 17 g packet Take 17 g by mouth daily as needed.     No current facility-administered medications for this visit.    Allergies:   Propoxyphene, Celecoxib, Erythromycin, Lanolin, Latex, Naproxen, Other, Oxytetracycline, Penicillins, Prednisone, Tape, and Codeine    Social History:  The patient  reports that she has never smoked. She has never used smokeless tobacco. She reports that she does not drink alcohol and does not use drugs.   Family History:  The patient's ***family history includes Breast cancer in her mother; Diabetes in her father; Heart disease in her mother; Heart failure in her father.    ROS:  Please see the history of present illness.   Otherwise, review of systems are positive for {NONE DEFAULTED:18576}.   All other systems are reviewed and negative.    PHYSICAL EXAM: VS:  There were no vitals  taken for this visit. , BMI There is no height or weight on file to calculate BMI. GENERAL:  Well appearing HEENT:  Pupils equal round and reactive, fundi not visualized, oral mucosa unremarkable NECK:  No jugular venous distention, waveform within normal limits, carotid upstroke brisk and symmetric, no bruits, no thyromegaly LYMPHATICS:  No cervical, inguinal adenopathy LUNGS:  Clear to auscultation bilaterally BACK:  No CVA tenderness CHEST:  Unremarkable HEART:  PMI not displaced or sustained,S1 and S2 within normal limits, no S3, no S4, no clicks, no rubs, *** murmurs ABD:  Flat, positive bowel sounds normal in frequency in pitch, no bruits, no rebound, no guarding, no midline pulsatile mass, no hepatomegaly, no splenomegaly EXT:  2 plus pulses throughout, no edema, no cyanosis no clubbing SKIN:  No rashes no nodules NEURO:  Cranial nerves II through XII grossly intact, motor grossly intact throughout PSYCH:  Cognitively intact, oriented to person place and time    EKG:  EKG {ACTION; IS/IS OEU:[id number]} ordered today. The ekg ordered today demonstrates ***   Recent Labs: No results found for requested labs within last 365 days.    Lipid Panel No results found for: "CHOL", "TRIG", "HDL", "CHOLHDL", "VLDL", "LDLCALC", "LDLDIRECT"    Wt Readings from Last 3 Encounters:  11/24/14 176 lb (79.8 kg)  Other studies Reviewed: Additional studies/ records that were reviewed today include: ***. Review of the above records demonstrates:  Please see elsewhere in the note.  ***   ASSESSMENT AND PLAN:  Atrial fib:  ***   Current medicines are reviewed at length with the patient today.  The patient {ACTIONS; HAS/DOES NOT HAVE:19233} concerns regarding medicines.  The following changes have been made:  {PLAN; NO CHANGE:13088:s}  Labs/ tests ordered today include: *** No orders of the defined types were placed in this encounter.    Disposition:   FU with ***     Signed, Minus Breeding, MD  06/10/2022 8:49 PM    [place]

## 2022-06-12 ENCOUNTER — Ambulatory Visit: Payer: Medicare HMO | Admitting: Cardiology

## 2022-06-15 DIAGNOSIS — L0390 Cellulitis, unspecified: Secondary | ICD-10-CM | POA: Diagnosis not present

## 2022-06-15 DIAGNOSIS — L8990 Pressure ulcer of unspecified site, unspecified stage: Secondary | ICD-10-CM | POA: Diagnosis not present

## 2022-07-01 DIAGNOSIS — M950 Acquired deformity of nose: Secondary | ICD-10-CM | POA: Diagnosis not present

## 2022-07-15 DIAGNOSIS — E7849 Other hyperlipidemia: Secondary | ICD-10-CM | POA: Diagnosis not present

## 2022-07-15 DIAGNOSIS — K219 Gastro-esophageal reflux disease without esophagitis: Secondary | ICD-10-CM | POA: Diagnosis not present

## 2022-07-15 DIAGNOSIS — I10 Essential (primary) hypertension: Secondary | ICD-10-CM | POA: Diagnosis not present

## 2022-07-15 DIAGNOSIS — E039 Hypothyroidism, unspecified: Secondary | ICD-10-CM | POA: Diagnosis not present

## 2022-07-18 DIAGNOSIS — E039 Hypothyroidism, unspecified: Secondary | ICD-10-CM | POA: Diagnosis not present

## 2022-07-18 DIAGNOSIS — I482 Chronic atrial fibrillation, unspecified: Secondary | ICD-10-CM | POA: Diagnosis not present

## 2022-07-18 DIAGNOSIS — E7849 Other hyperlipidemia: Secondary | ICD-10-CM | POA: Diagnosis not present

## 2022-07-18 DIAGNOSIS — Z6838 Body mass index (BMI) 38.0-38.9, adult: Secondary | ICD-10-CM | POA: Diagnosis not present

## 2022-07-18 DIAGNOSIS — I10 Essential (primary) hypertension: Secondary | ICD-10-CM | POA: Diagnosis not present

## 2022-07-18 DIAGNOSIS — Z85828 Personal history of other malignant neoplasm of skin: Secondary | ICD-10-CM | POA: Diagnosis not present

## 2022-07-18 DIAGNOSIS — Z23 Encounter for immunization: Secondary | ICD-10-CM | POA: Diagnosis not present

## 2022-07-18 DIAGNOSIS — K219 Gastro-esophageal reflux disease without esophagitis: Secondary | ICD-10-CM | POA: Diagnosis not present

## 2022-10-23 DIAGNOSIS — M170 Bilateral primary osteoarthritis of knee: Secondary | ICD-10-CM | POA: Diagnosis not present

## 2022-11-22 DIAGNOSIS — E039 Hypothyroidism, unspecified: Secondary | ICD-10-CM | POA: Diagnosis not present

## 2022-11-22 DIAGNOSIS — I10 Essential (primary) hypertension: Secondary | ICD-10-CM | POA: Diagnosis not present

## 2022-11-22 DIAGNOSIS — R946 Abnormal results of thyroid function studies: Secondary | ICD-10-CM | POA: Diagnosis not present

## 2022-11-22 DIAGNOSIS — E7849 Other hyperlipidemia: Secondary | ICD-10-CM | POA: Diagnosis not present

## 2022-11-22 DIAGNOSIS — K219 Gastro-esophageal reflux disease without esophagitis: Secondary | ICD-10-CM | POA: Diagnosis not present

## 2022-11-28 DIAGNOSIS — E039 Hypothyroidism, unspecified: Secondary | ICD-10-CM | POA: Diagnosis not present

## 2022-11-28 DIAGNOSIS — R946 Abnormal results of thyroid function studies: Secondary | ICD-10-CM | POA: Diagnosis not present

## 2022-11-28 DIAGNOSIS — M25511 Pain in right shoulder: Secondary | ICD-10-CM | POA: Diagnosis not present

## 2022-11-28 DIAGNOSIS — K219 Gastro-esophageal reflux disease without esophagitis: Secondary | ICD-10-CM | POA: Diagnosis not present

## 2022-11-28 DIAGNOSIS — Z85828 Personal history of other malignant neoplasm of skin: Secondary | ICD-10-CM | POA: Diagnosis not present

## 2022-11-28 DIAGNOSIS — I10 Essential (primary) hypertension: Secondary | ICD-10-CM | POA: Diagnosis not present

## 2022-11-28 DIAGNOSIS — E7849 Other hyperlipidemia: Secondary | ICD-10-CM | POA: Diagnosis not present

## 2022-11-28 DIAGNOSIS — I482 Chronic atrial fibrillation, unspecified: Secondary | ICD-10-CM | POA: Diagnosis not present

## 2022-12-28 DIAGNOSIS — H524 Presbyopia: Secondary | ICD-10-CM | POA: Diagnosis not present

## 2023-02-14 DIAGNOSIS — R030 Elevated blood-pressure reading, without diagnosis of hypertension: Secondary | ICD-10-CM | POA: Diagnosis not present

## 2023-02-14 DIAGNOSIS — M10072 Idiopathic gout, left ankle and foot: Secondary | ICD-10-CM | POA: Diagnosis not present

## 2023-02-14 DIAGNOSIS — E790 Hyperuricemia without signs of inflammatory arthritis and tophaceous disease: Secondary | ICD-10-CM | POA: Diagnosis not present

## 2023-03-20 DIAGNOSIS — R946 Abnormal results of thyroid function studies: Secondary | ICD-10-CM | POA: Diagnosis not present

## 2023-03-20 DIAGNOSIS — I10 Essential (primary) hypertension: Secondary | ICD-10-CM | POA: Diagnosis not present

## 2023-03-20 DIAGNOSIS — E7849 Other hyperlipidemia: Secondary | ICD-10-CM | POA: Diagnosis not present

## 2023-03-27 DIAGNOSIS — R21 Rash and other nonspecific skin eruption: Secondary | ICD-10-CM | POA: Diagnosis not present

## 2023-03-27 DIAGNOSIS — Z6838 Body mass index (BMI) 38.0-38.9, adult: Secondary | ICD-10-CM | POA: Diagnosis not present

## 2023-03-27 DIAGNOSIS — I482 Chronic atrial fibrillation, unspecified: Secondary | ICD-10-CM | POA: Diagnosis not present

## 2023-03-27 DIAGNOSIS — E039 Hypothyroidism, unspecified: Secondary | ICD-10-CM | POA: Diagnosis not present

## 2023-03-27 DIAGNOSIS — E7849 Other hyperlipidemia: Secondary | ICD-10-CM | POA: Diagnosis not present

## 2023-03-27 DIAGNOSIS — K219 Gastro-esophageal reflux disease without esophagitis: Secondary | ICD-10-CM | POA: Diagnosis not present

## 2023-03-27 DIAGNOSIS — I10 Essential (primary) hypertension: Secondary | ICD-10-CM | POA: Diagnosis not present

## 2023-03-27 DIAGNOSIS — Z85828 Personal history of other malignant neoplasm of skin: Secondary | ICD-10-CM | POA: Diagnosis not present

## 2023-05-06 DIAGNOSIS — E039 Hypothyroidism, unspecified: Secondary | ICD-10-CM | POA: Diagnosis not present

## 2023-07-02 DIAGNOSIS — M950 Acquired deformity of nose: Secondary | ICD-10-CM | POA: Diagnosis not present

## 2023-07-23 DIAGNOSIS — E039 Hypothyroidism, unspecified: Secondary | ICD-10-CM | POA: Diagnosis not present

## 2023-07-23 DIAGNOSIS — I10 Essential (primary) hypertension: Secondary | ICD-10-CM | POA: Diagnosis not present

## 2023-07-23 DIAGNOSIS — E7849 Other hyperlipidemia: Secondary | ICD-10-CM | POA: Diagnosis not present

## 2023-07-24 DIAGNOSIS — R7309 Other abnormal glucose: Secondary | ICD-10-CM | POA: Diagnosis not present

## 2023-07-29 DIAGNOSIS — Z23 Encounter for immunization: Secondary | ICD-10-CM | POA: Diagnosis not present

## 2023-07-29 DIAGNOSIS — Z85828 Personal history of other malignant neoplasm of skin: Secondary | ICD-10-CM | POA: Diagnosis not present

## 2023-07-29 DIAGNOSIS — E039 Hypothyroidism, unspecified: Secondary | ICD-10-CM | POA: Diagnosis not present

## 2023-07-29 DIAGNOSIS — Z6838 Body mass index (BMI) 38.0-38.9, adult: Secondary | ICD-10-CM | POA: Diagnosis not present

## 2023-07-29 DIAGNOSIS — I482 Chronic atrial fibrillation, unspecified: Secondary | ICD-10-CM | POA: Diagnosis not present

## 2023-07-29 DIAGNOSIS — I10 Essential (primary) hypertension: Secondary | ICD-10-CM | POA: Diagnosis not present

## 2023-07-29 DIAGNOSIS — K219 Gastro-esophageal reflux disease without esophagitis: Secondary | ICD-10-CM | POA: Diagnosis not present

## 2023-07-29 DIAGNOSIS — E7849 Other hyperlipidemia: Secondary | ICD-10-CM | POA: Diagnosis not present

## 2023-11-11 DIAGNOSIS — I10 Essential (primary) hypertension: Secondary | ICD-10-CM | POA: Diagnosis not present

## 2023-11-11 DIAGNOSIS — K219 Gastro-esophageal reflux disease without esophagitis: Secondary | ICD-10-CM | POA: Diagnosis not present

## 2023-11-11 DIAGNOSIS — Z1329 Encounter for screening for other suspected endocrine disorder: Secondary | ICD-10-CM | POA: Diagnosis not present

## 2023-11-11 DIAGNOSIS — E7849 Other hyperlipidemia: Secondary | ICD-10-CM | POA: Diagnosis not present

## 2023-11-26 DIAGNOSIS — I10 Essential (primary) hypertension: Secondary | ICD-10-CM | POA: Diagnosis not present

## 2023-11-26 DIAGNOSIS — E039 Hypothyroidism, unspecified: Secondary | ICD-10-CM | POA: Diagnosis not present

## 2023-11-26 DIAGNOSIS — E785 Hyperlipidemia, unspecified: Secondary | ICD-10-CM | POA: Diagnosis not present

## 2023-11-26 DIAGNOSIS — I482 Chronic atrial fibrillation, unspecified: Secondary | ICD-10-CM | POA: Diagnosis not present

## 2024-04-14 DIAGNOSIS — E785 Hyperlipidemia, unspecified: Secondary | ICD-10-CM | POA: Diagnosis not present

## 2024-04-14 DIAGNOSIS — R7309 Other abnormal glucose: Secondary | ICD-10-CM | POA: Diagnosis not present

## 2024-04-14 DIAGNOSIS — E039 Hypothyroidism, unspecified: Secondary | ICD-10-CM | POA: Diagnosis not present

## 2024-04-14 DIAGNOSIS — I10 Essential (primary) hypertension: Secondary | ICD-10-CM | POA: Diagnosis not present

## 2024-04-19 DIAGNOSIS — E039 Hypothyroidism, unspecified: Secondary | ICD-10-CM | POA: Diagnosis not present

## 2024-04-19 DIAGNOSIS — E785 Hyperlipidemia, unspecified: Secondary | ICD-10-CM | POA: Diagnosis not present

## 2024-04-19 DIAGNOSIS — I482 Chronic atrial fibrillation, unspecified: Secondary | ICD-10-CM | POA: Diagnosis not present

## 2024-04-19 DIAGNOSIS — I10 Essential (primary) hypertension: Secondary | ICD-10-CM | POA: Diagnosis not present

## 2024-06-23 DIAGNOSIS — M950 Acquired deformity of nose: Secondary | ICD-10-CM | POA: Diagnosis not present
